# Patient Record
Sex: Female | Born: 1965 | Race: Asian | Hispanic: No | Marital: Married | Smoking: Never smoker
Health system: Southern US, Community
[De-identification: ages and names within clinical notes are randomized; demographics above are authoritative.]

## PROBLEM LIST (undated history)

## (undated) DIAGNOSIS — T8859XA Other complications of anesthesia, initial encounter: Secondary | ICD-10-CM

## (undated) DIAGNOSIS — Z9889 Other specified postprocedural states: Secondary | ICD-10-CM

## (undated) DIAGNOSIS — R011 Cardiac murmur, unspecified: Secondary | ICD-10-CM

## (undated) DIAGNOSIS — B009 Herpesviral infection, unspecified: Secondary | ICD-10-CM

## (undated) DIAGNOSIS — N950 Postmenopausal bleeding: Secondary | ICD-10-CM

## (undated) DIAGNOSIS — R519 Headache, unspecified: Secondary | ICD-10-CM

## (undated) DIAGNOSIS — G4733 Obstructive sleep apnea (adult) (pediatric): Secondary | ICD-10-CM

## (undated) DIAGNOSIS — E785 Hyperlipidemia, unspecified: Secondary | ICD-10-CM

## (undated) HISTORY — PX: ARTHROSCOPIC REPAIR ACL: SUR80

## (undated) HISTORY — PX: ANTERIOR CRUCIATE LIGAMENT REPAIR: SHX115

## (undated) HISTORY — PX: WISDOM TOOTH EXTRACTION: SHX21

## (undated) HISTORY — PX: SALPINGOOPHORECTOMY: SHX82

## (undated) HISTORY — PX: OTHER SURGICAL HISTORY: SHX169

## (undated) HISTORY — PX: EYE SURGERY: SHX253

---

## 2007-07-07 DIAGNOSIS — Z87442 Personal history of urinary calculi: Secondary | ICD-10-CM

## 2007-07-07 HISTORY — DX: Personal history of urinary calculi: Z87.442

## 2014-10-18 ENCOUNTER — Other Ambulatory Visit (HOSPITAL_COMMUNITY)
Admission: RE | Admit: 2014-10-18 | Discharge: 2014-10-18 | Disposition: A | Discharge status: Home / Self Care | Payer: Managed Care, Other (non HMO) | Source: Ambulatory Visit | Attending: Internal Medicine | Admitting: Internal Medicine

## 2014-10-18 DIAGNOSIS — Z1151 Encounter for screening for human papillomavirus (HPV): Secondary | ICD-10-CM | POA: Diagnosis present

## 2014-10-18 DIAGNOSIS — Z01411 Encounter for gynecological examination (general) (routine) with abnormal findings: Secondary | ICD-10-CM | POA: Insufficient documentation

## 2014-10-24 ENCOUNTER — Other Ambulatory Visit: Payer: Self-pay

## 2014-10-24 DIAGNOSIS — Z1231 Encounter for screening mammogram for malignant neoplasm of breast: Secondary | ICD-10-CM

## 2014-11-15 ENCOUNTER — Ambulatory Visit: Payer: Self-pay

## 2014-11-22 ENCOUNTER — Ambulatory Visit
Admission: RE | Admit: 2014-11-22 | Discharge: 2014-11-22 | Disposition: A | Discharge status: Home / Self Care | Payer: Managed Care, Other (non HMO) | Source: Ambulatory Visit

## 2014-11-22 DIAGNOSIS — Z1231 Encounter for screening mammogram for malignant neoplasm of breast: Secondary | ICD-10-CM

## 2015-03-07 ENCOUNTER — Other Ambulatory Visit (HOSPITAL_COMMUNITY)
Admission: RE | Admit: 2015-03-07 | Discharge: 2015-03-07 | Disposition: A | Discharge status: Home / Self Care | Payer: Managed Care, Other (non HMO) | Source: Ambulatory Visit | Attending: Internal Medicine | Admitting: Internal Medicine

## 2015-03-07 ENCOUNTER — Other Ambulatory Visit: Payer: Self-pay | Admitting: Internal Medicine

## 2015-03-07 DIAGNOSIS — R87810 Cervical high risk human papillomavirus (HPV) DNA test positive: Secondary | ICD-10-CM | POA: Insufficient documentation

## 2015-03-07 DIAGNOSIS — Z1151 Encounter for screening for human papillomavirus (HPV): Secondary | ICD-10-CM | POA: Insufficient documentation

## 2015-03-07 DIAGNOSIS — Z01411 Encounter for gynecological examination (general) (routine) with abnormal findings: Secondary | ICD-10-CM | POA: Diagnosis present

## 2015-03-13 LAB — CYTOLOGY - PAP

## 2015-08-21 ENCOUNTER — Other Ambulatory Visit: Payer: Self-pay | Admitting: Orthopedic Surgery

## 2015-08-21 DIAGNOSIS — R52 Pain, unspecified: Secondary | ICD-10-CM

## 2015-08-21 DIAGNOSIS — R609 Edema, unspecified: Secondary | ICD-10-CM

## 2015-08-28 ENCOUNTER — Other Ambulatory Visit: Payer: Self-pay

## 2015-08-29 ENCOUNTER — Ambulatory Visit
Admission: RE | Admit: 2015-08-29 | Discharge: 2015-08-29 | Disposition: A | Discharge status: Home / Self Care | Payer: Managed Care, Other (non HMO) | Source: Ambulatory Visit | Attending: Orthopedic Surgery | Admitting: Orthopedic Surgery

## 2015-08-29 DIAGNOSIS — R609 Edema, unspecified: Secondary | ICD-10-CM

## 2015-08-29 DIAGNOSIS — R52 Pain, unspecified: Secondary | ICD-10-CM

## 2015-09-24 DIAGNOSIS — Z9889 Other specified postprocedural states: Secondary | ICD-10-CM | POA: Insufficient documentation

## 2015-10-24 ENCOUNTER — Other Ambulatory Visit: Payer: Self-pay | Admitting: Internal Medicine

## 2015-10-24 ENCOUNTER — Other Ambulatory Visit (HOSPITAL_COMMUNITY)
Admission: RE | Admit: 2015-10-24 | Discharge: 2015-10-24 | Disposition: A | Discharge status: Home / Self Care | Payer: Managed Care, Other (non HMO) | Source: Ambulatory Visit | Attending: Internal Medicine | Admitting: Internal Medicine

## 2015-10-24 DIAGNOSIS — Z01419 Encounter for gynecological examination (general) (routine) without abnormal findings: Secondary | ICD-10-CM | POA: Diagnosis present

## 2015-10-28 LAB — CYTOLOGY - PAP

## 2016-02-11 ENCOUNTER — Other Ambulatory Visit: Payer: Self-pay | Admitting: Internal Medicine

## 2016-02-11 DIAGNOSIS — Z1231 Encounter for screening mammogram for malignant neoplasm of breast: Secondary | ICD-10-CM

## 2016-02-17 ENCOUNTER — Ambulatory Visit
Admission: RE | Admit: 2016-02-17 | Discharge: 2016-02-17 | Disposition: A | Discharge status: Home / Self Care | Payer: Managed Care, Other (non HMO) | Source: Ambulatory Visit | Attending: Internal Medicine | Admitting: Internal Medicine

## 2016-02-17 DIAGNOSIS — Z1231 Encounter for screening mammogram for malignant neoplasm of breast: Secondary | ICD-10-CM

## 2016-11-06 ENCOUNTER — Other Ambulatory Visit: Payer: Self-pay | Admitting: Internal Medicine

## 2016-11-06 ENCOUNTER — Other Ambulatory Visit (HOSPITAL_COMMUNITY)
Admission: RE | Admit: 2016-11-06 | Discharge: 2016-11-06 | Disposition: A | Discharge status: Home / Self Care | Payer: Managed Care, Other (non HMO) | Source: Ambulatory Visit | Attending: Internal Medicine | Admitting: Internal Medicine

## 2016-11-06 DIAGNOSIS — Z01411 Encounter for gynecological examination (general) (routine) with abnormal findings: Secondary | ICD-10-CM | POA: Diagnosis present

## 2016-11-12 LAB — CYTOLOGY - PAP: DIAGNOSIS: NEGATIVE

## 2017-04-14 ENCOUNTER — Other Ambulatory Visit: Payer: Self-pay | Admitting: Internal Medicine

## 2017-04-14 DIAGNOSIS — Z1231 Encounter for screening mammogram for malignant neoplasm of breast: Secondary | ICD-10-CM

## 2017-05-03 ENCOUNTER — Ambulatory Visit
Admission: RE | Admit: 2017-05-03 | Discharge: 2017-05-03 | Disposition: A | Discharge status: Home / Self Care | Payer: Managed Care, Other (non HMO) | Source: Ambulatory Visit | Attending: Internal Medicine | Admitting: Internal Medicine

## 2017-05-03 ENCOUNTER — Encounter (INDEPENDENT_AMBULATORY_CARE_PROVIDER_SITE_OTHER): Payer: Self-pay

## 2017-05-03 ENCOUNTER — Encounter: Payer: Self-pay | Admitting: Radiology

## 2017-05-03 DIAGNOSIS — Z1231 Encounter for screening mammogram for malignant neoplasm of breast: Secondary | ICD-10-CM

## 2018-11-30 ENCOUNTER — Other Ambulatory Visit (HOSPITAL_COMMUNITY)
Admission: RE | Admit: 2018-11-30 | Discharge: 2018-11-30 | Disposition: A | Discharge status: Home / Self Care | Payer: Managed Care, Other (non HMO) | Source: Ambulatory Visit | Attending: Internal Medicine | Admitting: Internal Medicine

## 2018-11-30 ENCOUNTER — Other Ambulatory Visit: Payer: Self-pay | Admitting: Internal Medicine

## 2018-11-30 DIAGNOSIS — Z01411 Encounter for gynecological examination (general) (routine) with abnormal findings: Secondary | ICD-10-CM | POA: Insufficient documentation

## 2018-12-06 LAB — CYTOLOGY - PAP
Diagnosis: NEGATIVE
HPV: NOT DETECTED

## 2018-12-30 ENCOUNTER — Ambulatory Visit: Payer: Managed Care, Other (non HMO) | Admitting: Podiatry

## 2018-12-30 ENCOUNTER — Other Ambulatory Visit: Payer: Self-pay

## 2018-12-30 ENCOUNTER — Ambulatory Visit (INDEPENDENT_AMBULATORY_CARE_PROVIDER_SITE_OTHER): Payer: Managed Care, Other (non HMO)

## 2018-12-30 ENCOUNTER — Encounter: Payer: Self-pay | Admitting: Podiatry

## 2018-12-30 VITALS — BP 121/82 | HR 80 | Temp 98.0°F | Resp 16

## 2018-12-30 DIAGNOSIS — M216X1 Other acquired deformities of right foot: Secondary | ICD-10-CM | POA: Diagnosis not present

## 2018-12-30 DIAGNOSIS — M21619 Bunion of unspecified foot: Secondary | ICD-10-CM

## 2018-12-30 DIAGNOSIS — M779 Enthesopathy, unspecified: Secondary | ICD-10-CM

## 2018-12-30 DIAGNOSIS — M722 Plantar fascial fibromatosis: Secondary | ICD-10-CM

## 2018-12-30 DIAGNOSIS — M778 Other enthesopathies, not elsewhere classified: Secondary | ICD-10-CM

## 2018-12-30 DIAGNOSIS — M76829 Posterior tibial tendinitis, unspecified leg: Secondary | ICD-10-CM

## 2018-12-30 NOTE — Patient Instructions (Signed)

## 2019-01-01 NOTE — Progress Notes (Signed)
Subjective:   Patient ID: Tanya Wall, female   DOB: 53 y.o.   MRN: [id number]   HPI 53 year old female presents the office today for concerns of left foot pain which is been aching for about 6 months.  She states that she feels a pulling sensation in the arch of her foot when it is dorsiflexed and she cannot go barefoot.  She is trying to wear cushioned shoes and she is interested in possibly orthotics.  She states that she is been playing tennis more since been out of work due to the pandemic and she is also a Print production planner.  She denies any recent injury to her foot or any significant swelling.  She also gets a numbness to her toes at times.  This is on the left side.  No radiating pain.  No weakness.  No falls. She states that she did have plantar fasciitis and she self treated.    Review of Systems  All other systems reviewed and are negative.  No past medical history on file.  No past surgical history on file.   Current Outpatient Medications:  .  citalopram (CELEXA) 40 MG tablet, , Disp: , Rfl:  .  valACYclovir (VALTREX) 1000 MG tablet, , Disp: , Rfl:  .  zolpidem (AMBIEN) 5 MG tablet, , Disp: , Rfl:   No Known Allergies        Objective:  Physical Exam  General: AAO x3, NAD  Dermatological: Skin is warm, dry and supple bilateral. Nails x 10 are well manicured; remaining integument appears unremarkable at this time. There are no open sores, no preulcerative lesions, no rash or signs of infection present.  Vascular: Dorsalis Pedis artery and Posterior Tibial artery pedal pulses are 2/4 bilateral with immedate capillary fill time. There is no pain with calf compression, swelling, warmth, erythema.   Neruologic: Grossly intact via light touch bilateral.Protective threshold with Semmes Wienstein monofilament intact to all pedal sites bilateral.  Negative Tinel sign bilaterally.  Musculoskeletal: Flatfoot deformities present bilaterally.  Majority of tenderness is  submetatarsal 2 as well as on the plantar plate of the second digit.  Bunion deformity is present.  There is no area of pinpoint tenderness.  No palpable neuroma was identified.  Hammertoe contractures are starting.  No pain to the ankle.  Ankle, subtalar range of motion intact.  Flexor, extensor tendons appear to be intact.  Mild discomfort along medial band plantar fashion with the arch of the foot.  Muscular strength 5/5 in all groups tested bilateral.  Gait: Unassisted, Nonantalgic.       Assessment:   Left foot plantar fasciitis, capsulitis/metatarsalgia    Plan:  -Treatment options discussed including all alternatives, risks, and complications -Etiology of symptoms were discussed -X-rays were obtained and reviewed with the patient.  Bunion deformities present.  Starting of hammertoe contractures present.  No evidence of acute fracture.  Flatfoot is present. -We long discussion regards to treatment options.  I do think a lot of her forefoot pain is result of her flatfoot, bunion as well as her residual deformities.  We discussed traction exercises of her toes as well as her plantar fasciitis.  Discussed shoe modifications.  Ultimately I do think she will benefit from orthotics.  I will have her follow-up with Liliane Channel for inserts.  Hope this will help give her some arch support as well as with daily metatarsal offloading.  Trula Slade DPM

## 2019-01-09 ENCOUNTER — Ambulatory Visit (INDEPENDENT_AMBULATORY_CARE_PROVIDER_SITE_OTHER): Payer: Managed Care, Other (non HMO) | Admitting: Orthotics

## 2019-01-09 ENCOUNTER — Other Ambulatory Visit: Payer: Self-pay

## 2019-01-09 DIAGNOSIS — M778 Other enthesopathies, not elsewhere classified: Secondary | ICD-10-CM

## 2019-01-09 DIAGNOSIS — M722 Plantar fascial fibromatosis: Secondary | ICD-10-CM

## 2019-01-09 DIAGNOSIS — M216X2 Other acquired deformities of left foot: Secondary | ICD-10-CM

## 2019-01-09 DIAGNOSIS — M216X1 Other acquired deformities of right foot: Secondary | ICD-10-CM | POA: Diagnosis not present

## 2019-01-09 NOTE — Progress Notes (Signed)
Patient came into today to be cast for Custom Foot Orthotics. Upon recommendation of Dr. 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

## 2019-01-30 ENCOUNTER — Other Ambulatory Visit: Payer: Self-pay

## 2019-01-30 ENCOUNTER — Ambulatory Visit: Payer: Managed Care, Other (non HMO) | Admitting: Orthotics

## 2019-01-30 DIAGNOSIS — M216X1 Other acquired deformities of right foot: Secondary | ICD-10-CM

## 2019-01-30 DIAGNOSIS — M778 Other enthesopathies, not elsewhere classified: Secondary | ICD-10-CM

## 2019-01-30 NOTE — Progress Notes (Signed)
Patient came in today to pick up custom made foot orthotics.  The goals were accomplished and the patient reported no dissatisfaction with said orthotics.  Patient was advised of breakin period and how to report any issues. 

## 2019-04-18 ENCOUNTER — Other Ambulatory Visit: Payer: Self-pay | Admitting: Internal Medicine

## 2019-04-18 DIAGNOSIS — Z1231 Encounter for screening mammogram for malignant neoplasm of breast: Secondary | ICD-10-CM

## 2019-04-19 ENCOUNTER — Ambulatory Visit
Admission: RE | Admit: 2019-04-19 | Discharge: 2019-04-19 | Disposition: A | Discharge status: Home / Self Care | Payer: Managed Care, Other (non HMO) | Source: Ambulatory Visit | Attending: Internal Medicine | Admitting: Internal Medicine

## 2019-04-19 ENCOUNTER — Other Ambulatory Visit: Payer: Self-pay

## 2019-04-19 DIAGNOSIS — Z1231 Encounter for screening mammogram for malignant neoplasm of breast: Secondary | ICD-10-CM

## 2019-08-21 ENCOUNTER — Emergency Department (HOSPITAL_COMMUNITY)
Admission: EM | Admit: 2019-08-21 | Discharge: 2019-08-21 | Disposition: A | Discharge status: Home / Self Care | Payer: Managed Care, Other (non HMO) | Attending: Emergency Medicine | Admitting: Emergency Medicine

## 2019-08-21 ENCOUNTER — Encounter (HOSPITAL_COMMUNITY): Payer: Self-pay | Admitting: Emergency Medicine

## 2019-08-21 ENCOUNTER — Other Ambulatory Visit: Payer: Self-pay

## 2019-08-21 ENCOUNTER — Emergency Department (HOSPITAL_COMMUNITY): Payer: Managed Care, Other (non HMO)

## 2019-08-21 DIAGNOSIS — S4992XA Unspecified injury of left shoulder and upper arm, initial encounter: Secondary | ICD-10-CM | POA: Diagnosis present

## 2019-08-21 DIAGNOSIS — Y9389 Activity, other specified: Secondary | ICD-10-CM | POA: Diagnosis not present

## 2019-08-21 DIAGNOSIS — S43015A Anterior dislocation of left humerus, initial encounter: Secondary | ICD-10-CM | POA: Insufficient documentation

## 2019-08-21 DIAGNOSIS — Y929 Unspecified place or not applicable: Secondary | ICD-10-CM | POA: Insufficient documentation

## 2019-08-21 DIAGNOSIS — W010XXA Fall on same level from slipping, tripping and stumbling without subsequent striking against object, initial encounter: Secondary | ICD-10-CM | POA: Insufficient documentation

## 2019-08-21 DIAGNOSIS — Y999 Unspecified external cause status: Secondary | ICD-10-CM | POA: Insufficient documentation

## 2019-08-21 MED ORDER — LIDOCAINE HCL (PF) 1 % IJ SOLN
30.0000 mL | Freq: Once | INTRAMUSCULAR | Status: DC
  Filled 2019-08-21: qty 30

## 2019-08-21 MED ORDER — ONDANSETRON HCL 4 MG/2ML IJ SOLN
4.0000 mg | Freq: Once | INTRAMUSCULAR | Status: DC
  Filled 2019-08-21: qty 2

## 2019-08-21 MED ORDER — FENTANYL CITRATE (PF) 100 MCG/2ML IJ SOLN
50.0000 ug | Freq: Once | INTRAMUSCULAR | Status: AC
  Administered 2019-08-21: 23:00:00 50 ug via INTRAVENOUS

## 2019-08-21 MED ORDER — FENTANYL CITRATE (PF) 100 MCG/2ML IJ SOLN
50.0000 ug | Freq: Once | INTRAMUSCULAR | Status: AC
  Administered 2019-08-21: 50 ug via INTRAVENOUS
  Filled 2019-08-21: qty 2

## 2019-08-21 MED ORDER — FENTANYL CITRATE (PF) 100 MCG/2ML IJ SOLN
INTRAMUSCULAR | Status: AC
  Filled 2019-08-21: qty 2

## 2019-08-21 MED ORDER — ETOMIDATE 2 MG/ML IV SOLN
7.0000 mg | Freq: Once | INTRAVENOUS | Status: DC
  Filled 2019-08-21: qty 10

## 2019-08-21 NOTE — ED Triage Notes (Signed)
Pt c/o shoulder pain after being tripped by her dog. Pt shoulder splinted on arrival.

## 2019-08-21 NOTE — ED Provider Notes (Signed)
**Note Tanya-Identified via Obfuscation** [facility] EMERGENCY DEPARTMENT Provider Note   CSN: [id number] Arrival date & time: 08/21/19  2023     History Chief Complaint  Patient presents with  . Shoulder Pain    Tanya Wall is a 54 y.o. female.  The history is provided by the patient.  Shoulder Pain Location:  Shoulder Shoulder location:  L shoulder Injury: yes   Time since incident:  2 hours Mechanism of injury: fall   Fall:    Fall occurred:  Tripped (tripped over dog)   Height of fall:  Scientist, water quality of impact:  Outstretched arms Pain details:    Quality:  Aching   Severity:  Severe   Onset quality:  Sudden   Timing:  Constant   Progression:  Unchanged Dislocation: yes   Prior injury to area:  No Relieved by:  Immobilization Worsened by:  Movement and bearing weight Associated symptoms: no back pain, no fever, no muscle weakness, no neck pain and no numbness        History reviewed. No pertinent past medical history.  Patient Active Problem List   Diagnosis Date Noted  . S/P left knee arthroscopy 09/24/2015    History reviewed. No pertinent surgical history.   OB History   No obstetric history on file.     Family History  Problem Relation Age of Onset  . Breast cancer Paternal Aunt 45    Social History   Tobacco Use  . Smoking status: Never Smoker  . Smokeless tobacco: Never Used  Substance Use Topics  . Alcohol use: Yes  . Drug use: Not on file    Home Medications Prior to Admission medications   Medication Sig Start Date End Date Taking? Authorizing Provider  BIOTIN PO Take 1 tablet by mouth daily.   Yes [provider]  citalopram (CELEXA) 40 MG tablet Take 40 mg by mouth at bedtime.  12/08/18  Yes [provider]  Probiotic Product (MISC INTESTINAL FLORA REGULAT) CHEW Chew 1 each by mouth daily.   Yes [provider]  zolpidem (AMBIEN) 5 MG tablet Take 5 mg by mouth at bedtime.  12/28/18  Yes [provider]    Allergies    Patient has no known allergies.  Review of Systems   Review of Systems  Constitutional: Negative for fever.  Cardiovascular: Negative for chest pain.  Musculoskeletal: Negative for back pain and neck pain.  Neurological: Negative for headaches.  All other systems reviewed and are negative.   Physical Exam Updated Vital Signs BP (!) 147/95 (BP Location: Right Arm)   Pulse 81   Temp 100 F (37.8 C) (Oral)   Resp 20   Ht 5\' 5"  (1.651 m)   Wt 68.9 kg   SpO2 97%   BMI 25.29 kg/m   Physical Exam Vitals and nursing note reviewed.  Constitutional:      Appearance: She is well-developed. She is not toxic-appearing or diaphoretic.  HENT:     Head: Normocephalic and atraumatic.     Mouth/Throat:     Mouth: Mucous membranes are moist.     Pharynx: Oropharynx is clear.  Eyes:     General: No scleral icterus.    Conjunctiva/sclera: Conjunctivae normal.  Neck:     Comments: No c-spine TTP, step-off or deformity Cardiovascular:     Rate and Rhythm: Normal rate and regular rhythm.     Pulses: Normal pulses.     Heart sounds: No murmur.  Pulmonary:  Effort: Pulmonary effort is normal. No respiratory distress.     Breath sounds: Normal breath sounds.  Abdominal:     Palpations: Abdomen is soft.     Tenderness: There is no abdominal tenderness.  Musculoskeletal:        General: Tenderness (left shoulder) and deformity (left shoulder) present.     Cervical back: Normal range of motion and neck supple.  Skin:    General: Skin is warm and dry.  Neurological:     Mental Status: She is alert.     Sensory: No sensory deficit.     Motor: No weakness.  Psychiatric:        Mood and Affect: Mood normal.        Behavior: Behavior normal.     ED Results / Procedures / Treatments   Labs (all labs ordered are listed, but only abnormal results are displayed) Labs Reviewed - No data to display  EKG None  Radiology DG Shoulder Left  Result Date:  08/21/2019 CLINICAL DATA:  Pain after fall EXAM: LEFT SHOULDER - 2+ VIEW COMPARISON:  None. FINDINGS: AC joint is intact. Left lung apex is clear. Anterior inferior dislocation of the left humeral head with respect to the glenoid fossa. No definitive fracture is seen. IMPRESSION: Anterior inferior dislocation of the humeral head with respect to the glenoid fossa Electronically Signed   By: Jasmine Pang M.D.   On: 08/21/2019 21:33   DG Shoulder Left Portable  Result Date: 08/21/2019 CLINICAL DATA:  Dislocation status post reduction EXAM: LEFT SHOULDER COMPARISON:  08/21/2019 at 9:15 p.m. FINDINGS: Frontal and transscapular views of the left shoulder demonstrate reduction of the anterior dislocation seen previously. There is still slight anterior subluxation of the glenohumeral joint. No underlying fracture. Left chest is clear. IMPRESSION: 1. Reduction of glenohumeral dislocation, with minimal anterior subluxation of the humeral head relative to the glenoid fossa. 2. No acute fracture. Electronically Signed   By: Sharlet Salina M.D.   On: 08/21/2019 23:28    Procedures Reduction of dislocation  Date/Time: 08/21/2019 11:47 PM Performed by: Nardos Putnam, [place], MD Authorized by: Terald Sleeper, MD  Consent: Written consent obtained. Risks and benefits: risks, benefits and alternatives were discussed Consent given by: patient Patient understanding: patient states understanding of the procedure being performed Patient consent: the patient's understanding of the procedure matches consent given Procedure consent: procedure consent matches procedure scheduled Relevant documents: relevant documents present and verified Patient identity confirmed: verbally with patient and arm band Time out: Immediately prior to procedure a "time out" was called to verify the correct patient, procedure, equipment, support staff and site/side marked as required. Preparation: Patient was prepped and draped in the usual  sterile fashion. Local anesthesia used: yes Anesthesia method: intra-articular injection.  Anesthesia: Local anesthesia used: yes Local Anesthetic: lidocaine 1% without epinephrine Anesthetic total: 10 mL  Sedation: Patient sedated: no  Patient tolerance: patient tolerated the procedure well with no immediate complications    (including critical care time)  Medications Ordered in ED Medications  ondansetron (ZOFRAN) injection 4 mg (has no administration in time range)  etomidate (AMIDATE) injection 7 mg (has no administration in time range)  lidocaine (PF) (XYLOCAINE) 1 % injection 30 mL (has no administration in time range)  fentaNYL (SUBLIMAZE) 100 MCG/2ML injection (has no administration in time range)  fentaNYL (SUBLIMAZE) injection 50 mcg (50 mcg Intravenous Given 08/21/19 2158)  fentaNYL (SUBLIMAZE) injection 50 mcg (50 mcg Intravenous Given 08/21/19 2255)    ED Course  I  have reviewed the triage vital signs and the nursing notes.  Pertinent labs & imaging results that were available during my care of the patient were reviewed by me and considered in my medical decision making (see chart for details).  Clinical Course as of Aug 20 2344  Mon Aug 21, 2019  2320 Attempted bedside reduction with local lidocaine injection, tolerated well, awaiting xray film   [MT]  2335 Neurovascularly intact following reduction.  Will place in sling and have her f/u with ortho in 1 week.   [MT]    Clinical Course User Index [MT] Wyvonnia Dusky, MD   MDM Rules/Calculators/A&P                      Patient here for left shoulder injury, obvious dislocation on exam, neurovascular intact, anterior dislocation noted on x-ray.  Reduced as above without complication, repeat x-ray shows reduction with minimal subluxation, pain significantly improved after reduction.  Placed in sling, will follow up with orthopedic surgery.  Strict return precautions given, patient discharged in stable  condition.  Final Clinical Impression(s) / ED Diagnoses Final diagnoses:  Anterior dislocation of left shoulder, initial encounter    Rx / DC Orders ED Discharge Orders    None       Kaitland Lewellyn, [place], MD 08/21/19 2351    Wyvonnia Dusky, MD 08/22/19 1156

## 2019-08-21 NOTE — ED Notes (Signed)
Consent signed by pt for conscious sedation and reduction of left shoulder dislocation after procedure explained to pt by MD

## 2019-08-21 NOTE — ED Notes (Signed)
Dr. Renaye Rakers attempting to reduce Left arm dislocation

## 2019-12-11 ENCOUNTER — Other Ambulatory Visit (HOSPITAL_COMMUNITY)
Admission: RE | Admit: 2019-12-11 | Discharge: 2019-12-11 | Disposition: A | Discharge status: Home / Self Care | Payer: Managed Care, Other (non HMO) | Source: Ambulatory Visit | Attending: Internal Medicine | Admitting: Internal Medicine

## 2019-12-11 ENCOUNTER — Other Ambulatory Visit: Payer: Self-pay | Admitting: Internal Medicine

## 2019-12-11 DIAGNOSIS — Z124 Encounter for screening for malignant neoplasm of cervix: Secondary | ICD-10-CM | POA: Diagnosis present

## 2019-12-11 DIAGNOSIS — Z1231 Encounter for screening mammogram for malignant neoplasm of breast: Secondary | ICD-10-CM

## 2019-12-11 DIAGNOSIS — Z780 Asymptomatic menopausal state: Secondary | ICD-10-CM

## 2019-12-13 LAB — CYTOLOGY - PAP
Comment: NEGATIVE
Diagnosis: NEGATIVE
High risk HPV: NEGATIVE

## 2020-04-19 ENCOUNTER — Other Ambulatory Visit: Payer: Self-pay

## 2020-04-19 ENCOUNTER — Ambulatory Visit: Payer: Managed Care, Other (non HMO)

## 2020-04-19 ENCOUNTER — Ambulatory Visit
Admission: RE | Admit: 2020-04-19 | Discharge: 2020-04-19 | Disposition: A | Discharge status: Home / Self Care | Payer: Managed Care, Other (non HMO) | Source: Ambulatory Visit | Attending: Internal Medicine | Admitting: Internal Medicine

## 2020-04-19 DIAGNOSIS — Z780 Asymptomatic menopausal state: Secondary | ICD-10-CM

## 2020-05-24 ENCOUNTER — Other Ambulatory Visit: Payer: Self-pay

## 2020-05-24 ENCOUNTER — Ambulatory Visit
Admission: RE | Admit: 2020-05-24 | Discharge: 2020-05-24 | Disposition: A | Discharge status: Home / Self Care | Payer: Managed Care, Other (non HMO) | Source: Ambulatory Visit | Attending: Internal Medicine | Admitting: Internal Medicine

## 2020-05-24 DIAGNOSIS — Z1231 Encounter for screening mammogram for malignant neoplasm of breast: Secondary | ICD-10-CM

## 2020-06-03 ENCOUNTER — Telehealth: Payer: Self-pay | Admitting: Podiatry

## 2020-06-03 NOTE — Telephone Encounter (Signed)
Pt called and left message about getting an another pair of orthotics.  I returned call and explained that if we are billing insurance pt would have to have an appt with the doctor and the cost of the new ones are 438.00 before insurance.  She said last year her insurance covered them. It looks like last yr I verified benefits and they were covered @ 80% after deductible. Pt is not sure if she has met the deductible but is going to check.. I also gave her the cpt for orthotics L3020 to check when she calls the insurance company. She said sometimes the current ones smell and I told pt we maybe able to change the top cover if we refurbish to more like a vinyl/leather type material  so they do not absorb the sweat as bad. We also discussed refurbishing her current ones for the 90.00. Once insurance is checked she may call to make an appt and may get the new pair if covered and refurbish the old ones. She said the old ones have worn on the bottom

## 2020-07-16 ENCOUNTER — Telehealth: Payer: Self-pay | Admitting: Podiatry

## 2020-07-16 DIAGNOSIS — M216X2 Other acquired deformities of left foot: Secondary | ICD-10-CM

## 2020-07-16 DIAGNOSIS — M216X1 Other acquired deformities of right foot: Secondary | ICD-10-CM

## 2020-07-16 NOTE — Telephone Encounter (Signed)
Pt left voicemail stating she is interested in getting her old orthotics fixed(refurbished) and needs to know how to go about it.  I returned call and explained that it 90.00 to refurbish and it is currently taking about 4 wks. She can just drop them off at the front desk, pay the 90.00 fee and I will call when they come back in.  She then asked if there was any way to make them not absorb her sweat and become smelly. I told her we could change the top cover to a leather like material but to put a note on the orthotics when she drops them off. She also asked about getting a second pair of orthotics and I explained she would have to schedule an appt to see the provider if we are to bill insurance since it has been over a year since she has been seen.

## 2020-07-19 NOTE — Telephone Encounter (Signed)
If she is not having any issues then put her on Rick's schedule and mine and I will come over and see her.

## 2021-01-14 ENCOUNTER — Other Ambulatory Visit: Payer: Self-pay | Admitting: Internal Medicine

## 2021-01-14 DIAGNOSIS — E785 Hyperlipidemia, unspecified: Secondary | ICD-10-CM

## 2021-02-11 ENCOUNTER — Ambulatory Visit
Admission: RE | Admit: 2021-02-11 | Discharge: 2021-02-11 | Disposition: A | Discharge status: Home / Self Care | Payer: Self-pay | Source: Ambulatory Visit | Attending: Internal Medicine | Admitting: Internal Medicine

## 2021-02-11 ENCOUNTER — Other Ambulatory Visit: Payer: Self-pay

## 2021-02-11 DIAGNOSIS — E785 Hyperlipidemia, unspecified: Secondary | ICD-10-CM

## 2021-02-13 IMAGING — CR DG SHOULDER 2+V*L*
3 series · 3 of 3 positions shown · non-contrast
Comparison: None.

CLINICAL DATA: Pain after fall

EXAM:
LEFT SHOULDER - 2+ VIEW

[shoulder grashey]
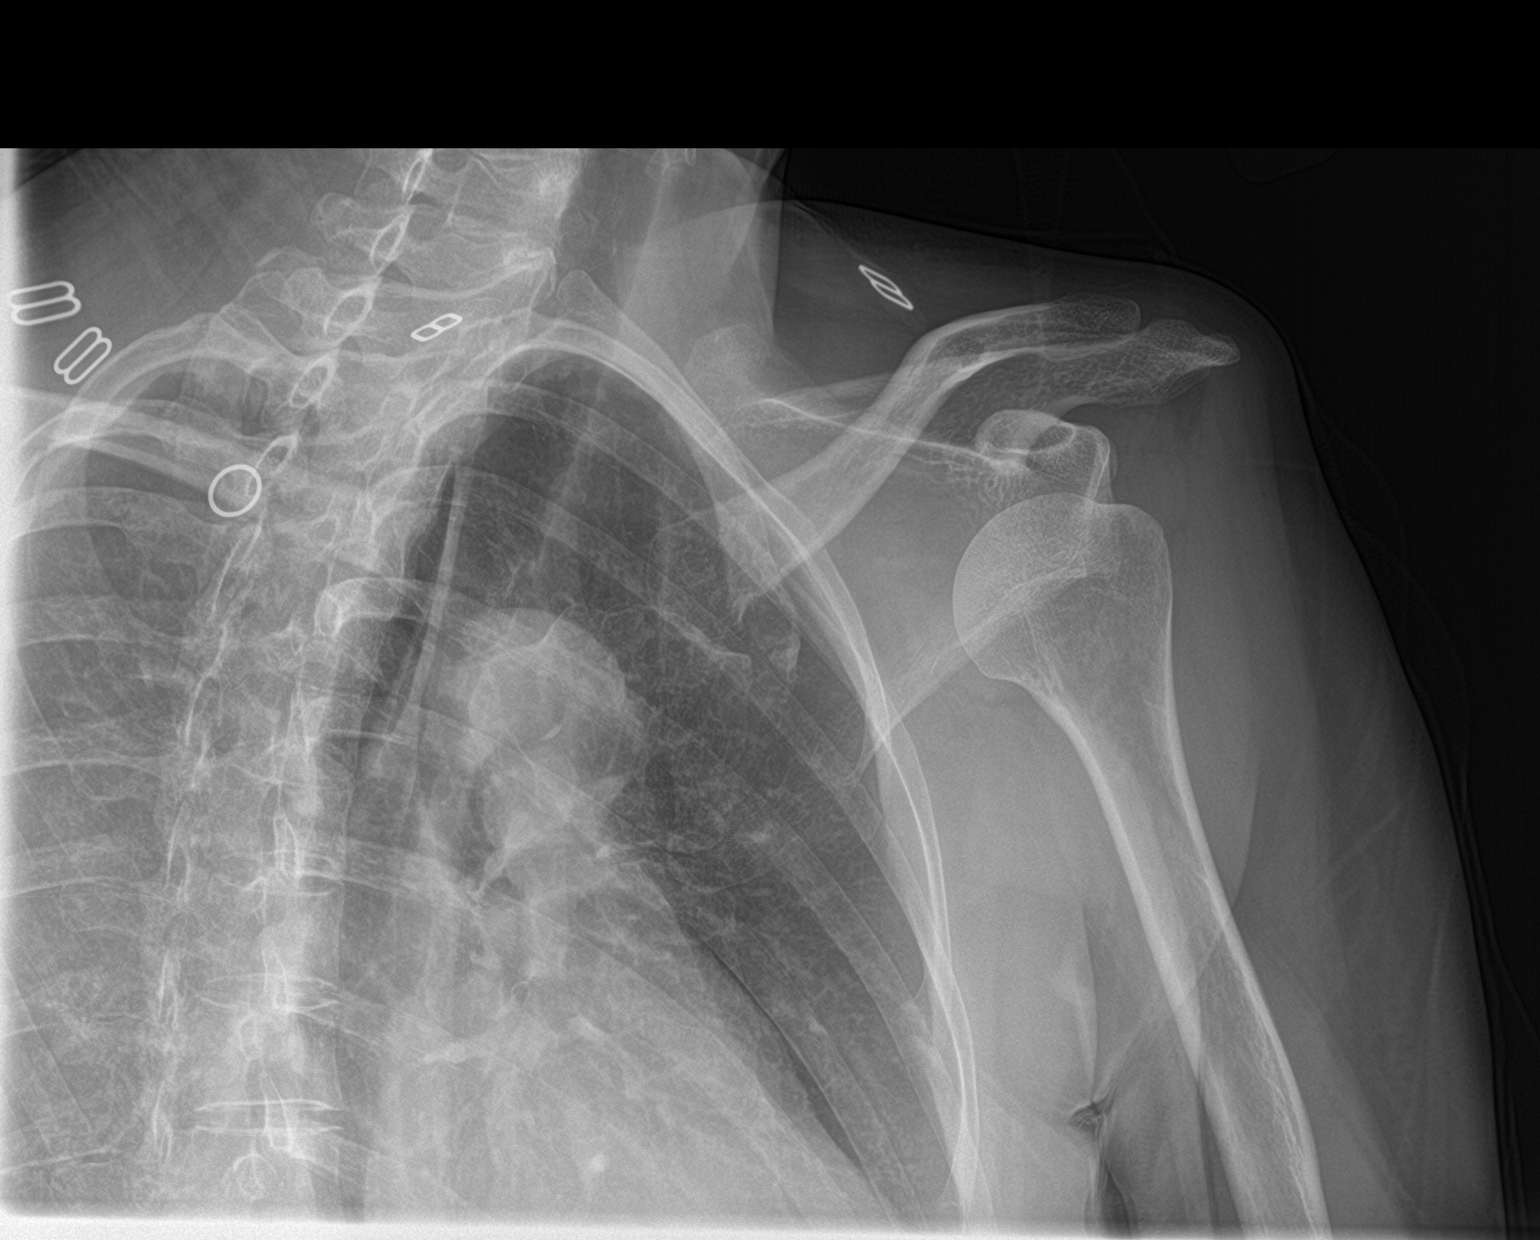

[shoulder y view]
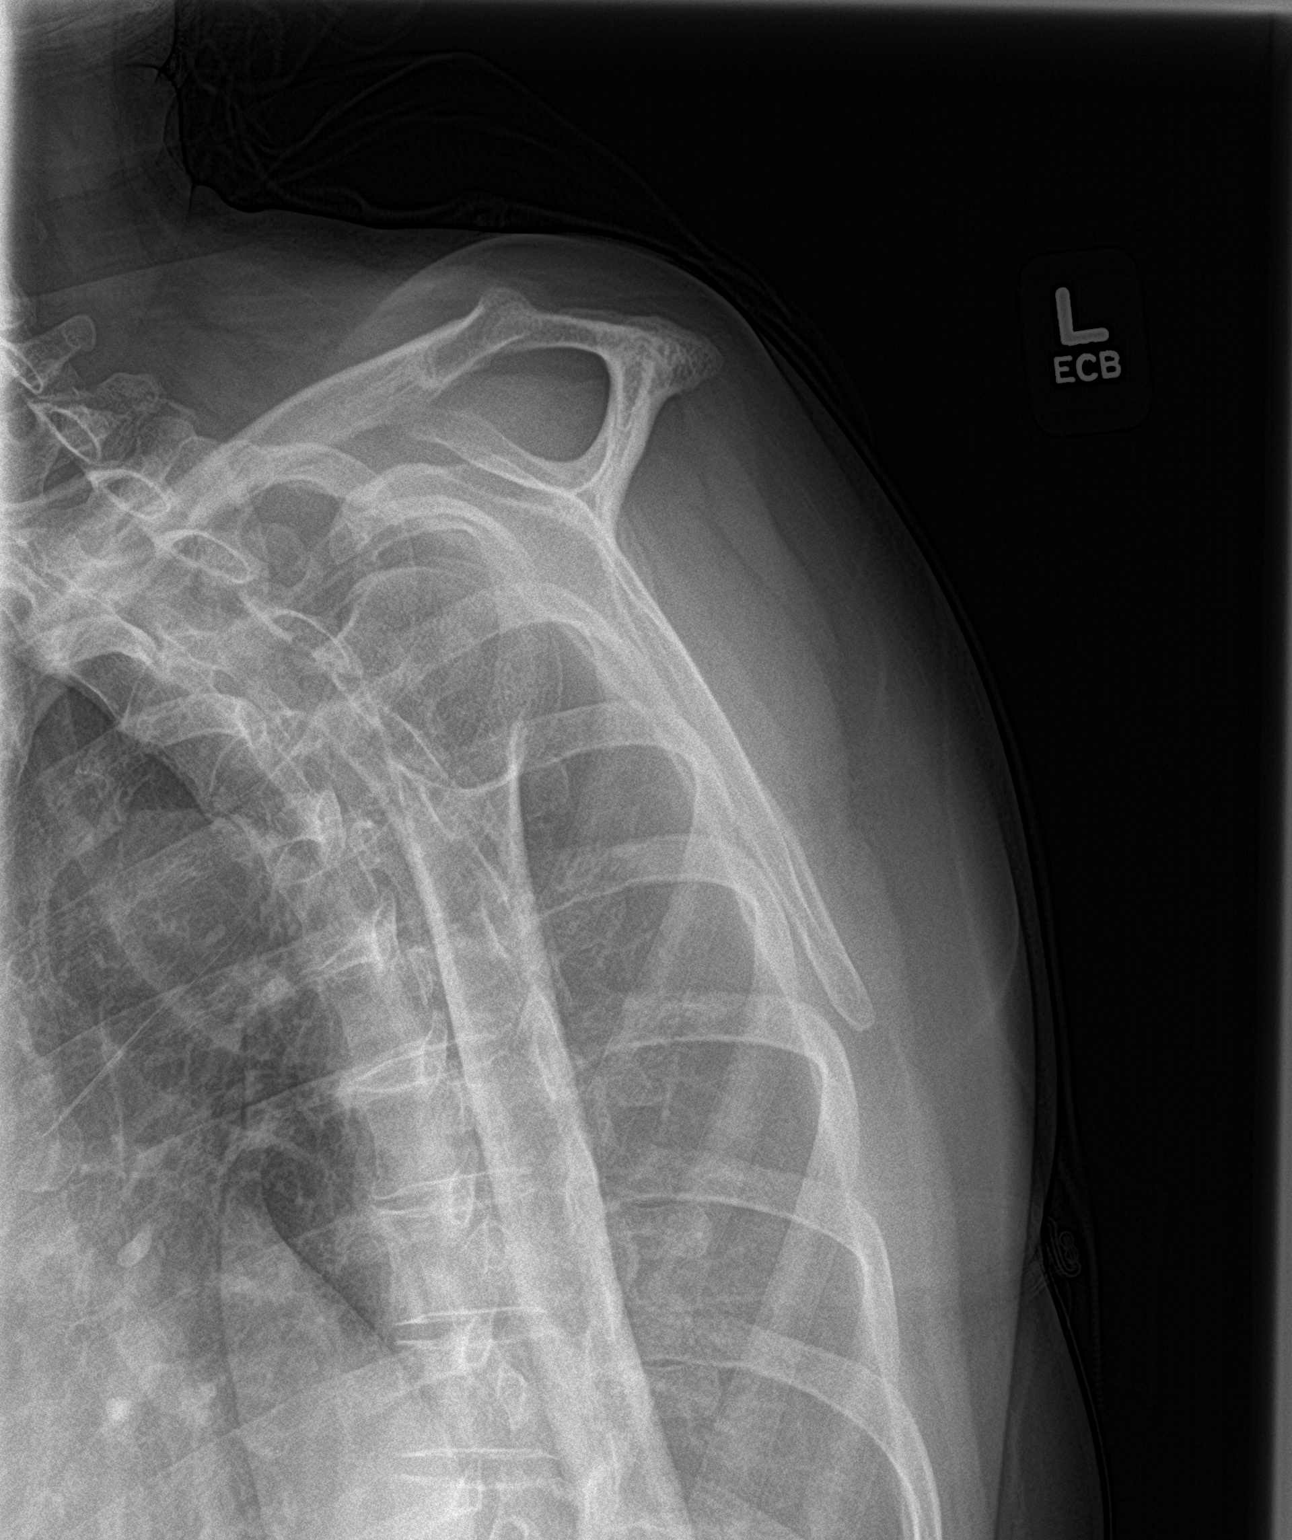

[shoulder ap neutral]
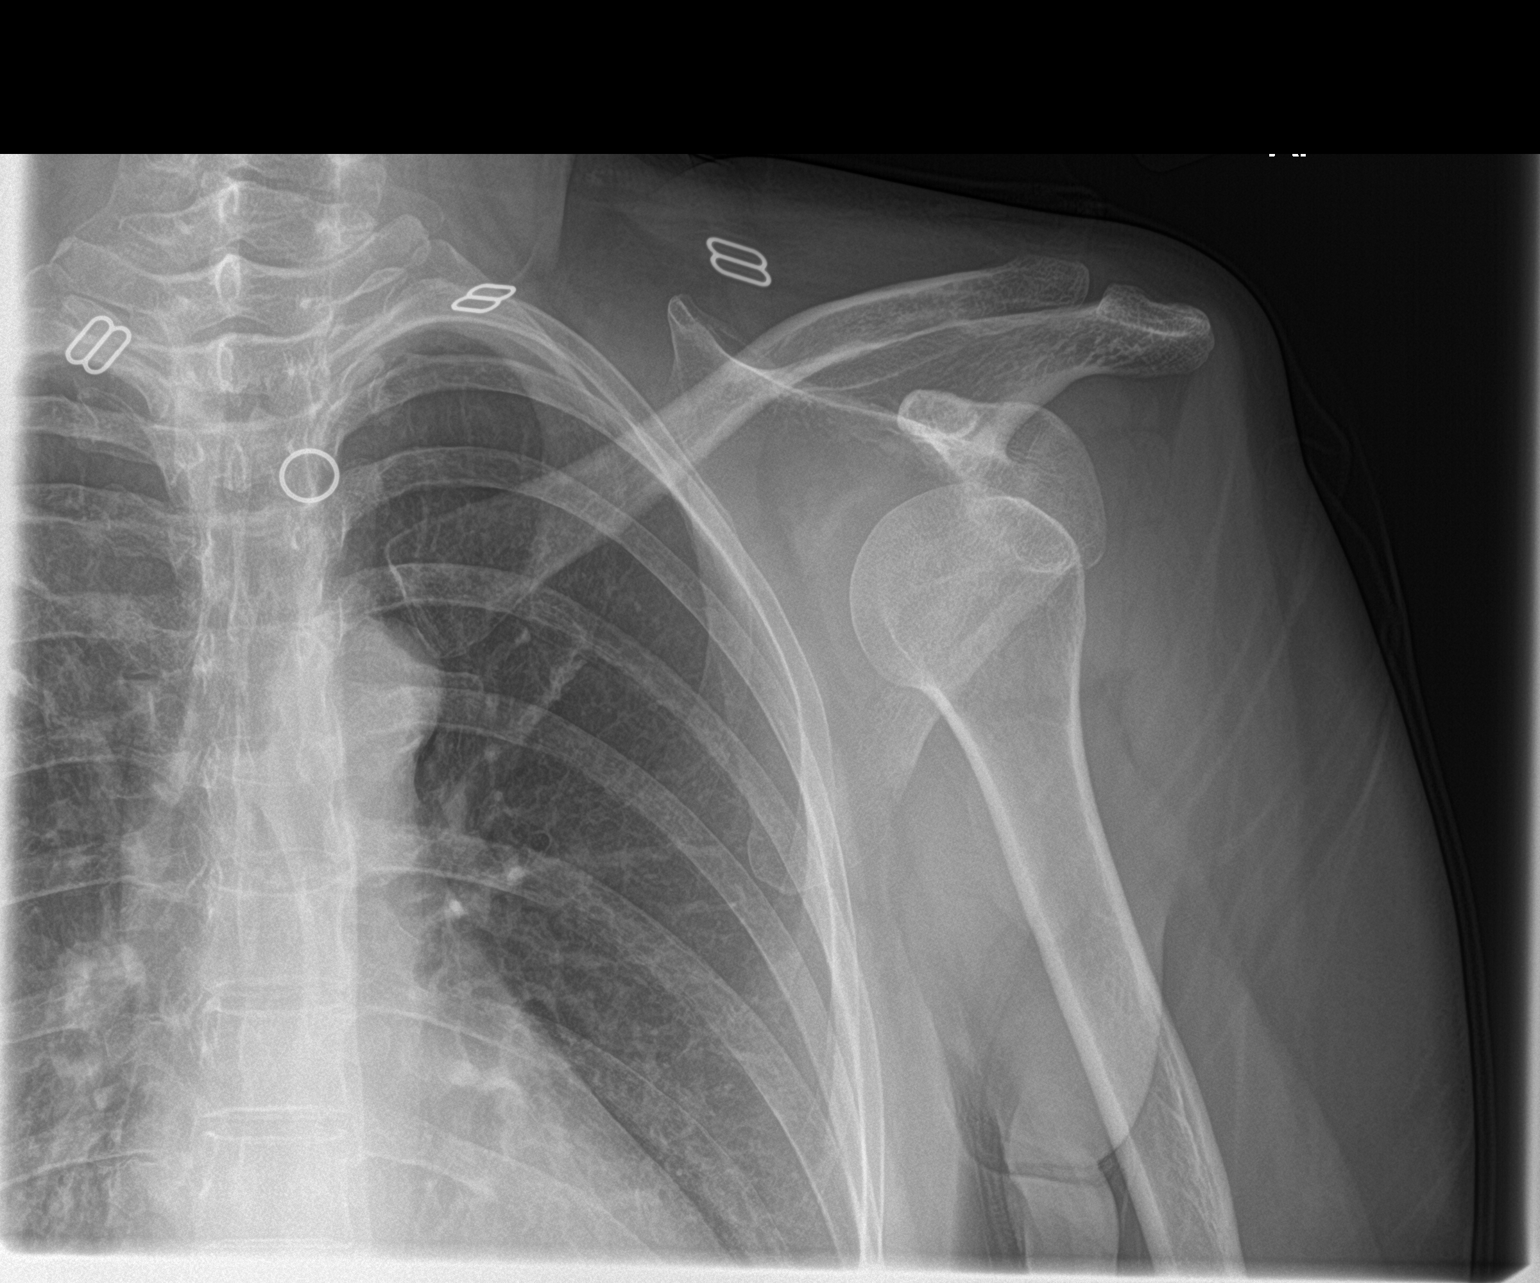

[3 of 3 positions shown; findings below may reference images not displayed]

FINDINGS: AC joint is intact. Left lung apex is clear. Anterior inferior
dislocation of the left humeral head with respect to the glenoid
fossa. No definitive fracture is seen.
IMPRESSION: Anterior inferior dislocation of the humeral head with respect to
the glenoid fossa

## 2022-01-03 DIAGNOSIS — M722 Plantar fascial fibromatosis: Secondary | ICD-10-CM

## 2022-01-03 HISTORY — DX: Plantar fascial fibromatosis: M72.2

## 2022-01-20 ENCOUNTER — Ambulatory Visit: Payer: Managed Care, Other (non HMO) | Admitting: Podiatry

## 2022-01-20 ENCOUNTER — Ambulatory Visit (INDEPENDENT_AMBULATORY_CARE_PROVIDER_SITE_OTHER): Payer: Managed Care, Other (non HMO)

## 2022-01-20 DIAGNOSIS — M779 Enthesopathy, unspecified: Secondary | ICD-10-CM

## 2022-01-20 DIAGNOSIS — M722 Plantar fascial fibromatosis: Secondary | ICD-10-CM

## 2022-01-20 MED ORDER — MELOXICAM 15 MG PO TABS: 15.0000 mg | ORAL_TABLET | Freq: Every day | ORAL | 0 refills | Status: DC | PRN

## 2022-01-20 NOTE — Progress Notes (Signed)
Subjective:   Patient ID: Tanya Wall, female   DOB: 56 y.o.   MRN: [id number]   HPI Chief Complaint  Patient presents with   Foot Pain    Patient reports heel pain to right foot- Patient described pain as sharp and pain is worst in the morning and standing after sitting for long period of time. Pain has been on and off for couple years but has gotten worst in the last 6-8 months.     56 year old female with above complaint.  Thinks her feet are very flat. She had inserts from last appointment which helped. She has had plantar fasciitis for a while but right foot has been getting progressivley worse. 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 No injury. No numbenss or tingling. Pain is worse in the morning or in the middle of the night to go to hte bathroom. Pain after prolonged sitting. Once she gets moving the pain gets better. She played tennis last night and it didn't hurt when playing but it was after when sitting. She stretches which helps some.  She has inserts but she is not sure if it is providing the support that she needs.   Review of Systems  All other systems reviewed and are negative.  No past medical history on file.  No past surgical history on file.   Current Outpatient Medications:    meloxicam (MOBIC) 15 MG tablet, Take 1 tablet (15 mg total) by mouth daily as needed for pain., Disp: 30 tablet, Rfl: 0   BIOTIN PO, Take 1 tablet by mouth daily., Disp: , Rfl:    citalopram (CELEXA) 40 MG tablet, Take 40 mg by mouth at bedtime. , Disp: , Rfl:    Probiotic Product (MISC INTESTINAL FLORA REGULAT) CHEW, Chew 1 each by mouth daily., Disp: , Rfl:    zolpidem (AMBIEN) 5 MG tablet, Take 5 mg by mouth at bedtime. , Disp: , Rfl:   No Known Allergies       Objective:  Physical Exam  General: AAO x3, NAD  Dermatological: Skin is warm, dry and supple bilateral.   Vascular: Dorsalis Pedis artery and Posterior Tibial artery pedal  pulses are 2/4 bilateral with immedate capillary fill time. There is no pain with calf compression, swelling, warmth, erythema.   Neruologic: Grossly intact via light touch bilateral.  Negative Tinel sign.  Musculoskeletal: Tenderness to palpation along the plantar medial tubercle of the calcaneus at the insertion of plantar fascia on the right foot. There is mild discomfort along the course of the plantar fascia within the arch of the foot. Plantar fascia appears to be intact. There is no pain with lateral compression of the calcaneus or pain with vibratory sensation. There is no pain along the course or insertion of the achilles tendon. No other areas of tenderness to bilateral lower extremities.  Muscular strength 5/5 in all groups tested bilateral.  Gait: Unassisted, Nonantalgic.       Assessment:   Right heel pain, Planter fasciitis     Plan:  -Treatment options discussed including all alternatives, risks, and complications -Etiology of symptoms were discussed -X-rays were obtained and reviewed with the patient.  3 views of the right foot were obtained.  No evidence of acute fracture.  No significant calcaneal spurring. -Plantar fascia brace to help aid in support and stability during the day. -She does not report signs of compartment -Prescribed mobic. Discussed side effects of the medication  and directed to stop if any are to occur and call the office.  -Like further from the orthotics since we can evaluate this about possible modifications. -Stretching, icing daily  Vivi Barrack DPM

## 2022-01-20 NOTE — Progress Notes (Signed)
Ot p

## 2022-01-20 NOTE — Patient Instructions (Signed)
For instructions on how to put on your Plantar Fascial Brace, please visit [email or web address]   Plantar Fasciitis (Heel Spur Syndrome) with Rehab The plantar fascia is a fibrous, ligament-like, soft-tissue structure that spans the bottom of the foot. Plantar fasciitis is a condition that causes pain in the foot due to inflammation of the tissue. SYMPTOMS   Pain and tenderness on the underneath side of the foot.  Pain that worsens with standing or walking. CAUSES  Plantar fasciitis is caused by irritation and injury to the plantar fascia on the underneath side of the foot. Common mechanisms of injury include:  Direct trauma to bottom of the foot.  Damage to a small nerve that runs under the foot where the main fascia attaches to the heel bone.  Stress placed on the plantar fascia due to bone spurs. RISK INCREASES WITH:   Activities that place stress on the plantar fascia (running, jumping, pivoting, or cutting).  Poor strength and flexibility.  Improperly fitted shoes.  Tight calf muscles.  Flat feet.  Failure to warm-up properly before activity.  Obesity. PREVENTION  Warm up and stretch properly before activity.  Allow for adequate recovery between workouts.  Maintain physical fitness:  Strength, flexibility, and endurance.  Cardiovascular fitness.  Maintain a health body weight.  Avoid stress on the plantar fascia.  Wear properly fitted shoes, including arch supports for individuals who have flat feet.  PROGNOSIS  If treated properly, then the symptoms of plantar fasciitis usually resolve without surgery. However, occasionally surgery is necessary.  RELATED COMPLICATIONS   Recurrent symptoms that may result in a chronic condition.  Problems of the lower back that are caused by compensating for the injury, such as limping.  Pain or weakness of the foot during push-off following surgery.  Chronic inflammation, scarring, and partial or complete  fascia tear, occurring more often from repeated injections.  TREATMENT  Treatment initially involves the use of ice and medication to help reduce pain and inflammation. The use of strengthening and stretching exercises may help reduce pain with activity, especially stretches of the Achilles tendon. These exercises may be performed at home or with a therapist. Your caregiver may recommend that you use heel cups of arch supports to help reduce stress on the plantar fascia. Occasionally, corticosteroid injections are given to reduce inflammation. If symptoms persist for greater than 6 months despite non-surgical (conservative), then surgery may be recommended.   MEDICATION   If pain medication is necessary, then nonsteroidal anti-inflammatory medications, such as aspirin and ibuprofen, or other minor pain relievers, such as acetaminophen, are often recommended.  Do not take pain medication within 7 days before surgery.  Prescription pain relievers may be given if deemed necessary by your caregiver. Use only as directed and only as much as you need.  Corticosteroid injections may be given by your caregiver. These injections should be reserved for the most serious cases, because they may only be given a certain number of times.  HEAT AND COLD  Cold treatment (icing) relieves pain and reduces inflammation. Cold treatment should be applied for 10 to 15 minutes every 2 to 3 hours for inflammation and pain and immediately after any activity that aggravates your symptoms. Use ice packs or massage the area with a piece of ice (ice massage).  Heat treatment may be used prior to performing the stretching and strengthening activities prescribed by your caregiver, physical therapist, or athletic trainer. Use a heat pack or soak the injury in warm water.  SEEK IMMEDIATE MEDICAL   CARE IF:  Treatment seems to offer no benefit, or the condition worsens.  Any medications produce adverse side effects.   EXERCISES- RANGE OF MOTION (ROM) AND STRETCHING EXERCISES - Plantar Fasciitis (Heel Spur Syndrome) These exercises may help you when beginning to rehabilitate your injury. Your symptoms may resolve with or without further involvement from your physician, physical therapist or athletic trainer. While completing these exercises, remember:   Restoring tissue flexibility helps normal motion to return to the joints. This allows healthier, less painful movement and activity.  An effective stretch should be held for at least 30 seconds.  A stretch should never be painful. You should only feel a gentle lengthening or release in the stretched tissue.  RANGE OF MOTION - Toe Extension, Flexion  Sit with your right / left leg crossed over your opposite knee.  Grasp your toes and gently pull them back toward the top of your foot. You should feel a stretch on the bottom of your toes and/or foot.  Hold this stretch for 10 seconds.  Now, gently pull your toes toward the bottom of your foot. You should feel a stretch on the top of your toes and or foot.  Hold this stretch for 10 seconds. Repeat  times. Complete this stretch 3 times per day.   RANGE OF MOTION - Ankle Dorsiflexion, Active Assisted  Remove shoes and sit on a chair that is preferably not on a carpeted surface.  Place right / left foot under knee. Extend your opposite leg for support.  Keeping your heel down, slide your right / left foot back toward the chair until you feel a stretch at your ankle or calf. If you do not feel a stretch, slide your bottom forward to the edge of the chair, while still keeping your heel down.  Hold this stretch for 10 seconds. Repeat 3 times. Complete this stretch 2 times per day.   STRETCH  Gastroc, Standing  Place hands on wall.  Extend right / left leg, keeping the front knee somewhat bent.  Slightly point your toes inward on your back foot.  Keeping your right / left heel on the floor and your  knee straight, shift your weight toward the wall, not allowing your back to arch.  You should feel a gentle stretch in the right / left calf. Hold this position for 10 seconds. Repeat 3 times. Complete this stretch 2 times per day.  STRETCH  Soleus, Standing  Place hands on wall.  Extend right / left leg, keeping the other knee somewhat bent.  Slightly point your toes inward on your back foot.  Keep your right / left heel on the floor, bend your back knee, and slightly shift your weight over the back leg so that you feel a gentle stretch deep in your back calf.  Hold this position for 10 seconds. Repeat 3 times. Complete this stretch 2 times per day.  STRETCH  Gastrocsoleus, Standing  Note: This exercise can place a lot of stress on your foot and ankle. Please complete this exercise only if specifically instructed by your caregiver.   Place the ball of your right / left foot on a step, keeping your other foot firmly on the same step.  Hold on to the wall or a rail for balance.  Slowly lift your other foot, allowing your body weight to press your heel down over the edge of the step.  You should feel a stretch in your right / left calf.  Hold this   position for 10 seconds.  Repeat this exercise with a slight bend in your right / left knee. Repeat 3 times. Complete this stretch 2 times per day.   STRENGTHENING EXERCISES - Plantar Fasciitis (Heel Spur Syndrome)  These exercises may help you when beginning to rehabilitate your injury. They may resolve your symptoms with or without further involvement from your physician, physical therapist or athletic trainer. While completing these exercises, remember:   Muscles can gain both the endurance and the strength needed for everyday activities through controlled exercises.  Complete these exercises as instructed by your physician, physical therapist or athletic trainer. Progress the resistance and repetitions only as guided.  STRENGTH -  Towel Curls  Sit in a chair positioned on a non-carpeted surface.  Place your foot on a towel, keeping your heel on the floor.  Pull the towel toward your heel by only curling your toes. Keep your heel on the floor. Repeat 3 times. Complete this exercise 2 times per day.  STRENGTH - Ankle Inversion  Secure one end of a rubber exercise band/tubing to a fixed object (table, pole). Loop the other end around your foot just before your toes.  Place your fists between your knees. This will focus your strengthening at your ankle.  Slowly, pull your big toe up and in, making sure the band/tubing is positioned to resist the entire motion.  Hold this position for 10 seconds.  Have your muscles resist the band/tubing as it slowly pulls your foot back to the starting position. Repeat 3 times. Complete this exercises 2 times per day.  Document Released: 06/22/2005 Document Revised: 09/14/2011 Document Reviewed: 10/04/2008 [employer] Patient Information 2014 [employer]. 

## 2022-01-23 ENCOUNTER — Ambulatory Visit (INDEPENDENT_AMBULATORY_CARE_PROVIDER_SITE_OTHER): Payer: Managed Care, Other (non HMO)

## 2022-01-23 DIAGNOSIS — M779 Enthesopathy, unspecified: Secondary | ICD-10-CM | POA: Diagnosis not present

## 2022-01-23 DIAGNOSIS — M722 Plantar fascial fibromatosis: Secondary | ICD-10-CM | POA: Diagnosis not present

## 2022-01-23 NOTE — Progress Notes (Signed)
Patient presents today to be casted for custom molded orthotics. Dr. Ardelle Anton has been treating patient for capsulitis.   Impression foam cast was taken. ABN signed.  Patient info-  Shoe size: 8 women's wide  Shoe style: alethic shoes  Height: 5'4"  Weight: 165lb    Patient will be notified once orthotics arrive in office and reappoint for fitting at that time.

## 2022-02-11 ENCOUNTER — Ambulatory Visit (INDEPENDENT_AMBULATORY_CARE_PROVIDER_SITE_OTHER): Payer: Managed Care, Other (non HMO)

## 2022-02-11 DIAGNOSIS — M779 Enthesopathy, unspecified: Secondary | ICD-10-CM

## 2022-02-11 DIAGNOSIS — M722 Plantar fascial fibromatosis: Secondary | ICD-10-CM

## 2022-02-11 NOTE — Progress Notes (Signed)
Patient presents today to pick up custom molded foot orthotics, diagnosed with plantar fasciitis and capsulitis by Dr. Ardelle Anton.   Orthotics were dispensed and fit was satisfactory. Reviewed instructions for break-in and wear. Written instructions given to patient.  Patient will follow up as needed.   [facility] - order # [id number]

## 2022-06-25 ENCOUNTER — Other Ambulatory Visit: Payer: Self-pay | Admitting: Internal Medicine

## 2022-06-25 DIAGNOSIS — Z1231 Encounter for screening mammogram for malignant neoplasm of breast: Secondary | ICD-10-CM

## 2022-07-03 ENCOUNTER — Ambulatory Visit
Admission: RE | Admit: 2022-07-03 | Discharge: 2022-07-03 | Disposition: A | Discharge status: Home / Self Care | Payer: Managed Care, Other (non HMO) | Source: Ambulatory Visit | Attending: Internal Medicine | Admitting: Internal Medicine

## 2022-07-03 DIAGNOSIS — Z1231 Encounter for screening mammogram for malignant neoplasm of breast: Secondary | ICD-10-CM

## 2022-07-06 DIAGNOSIS — N950 Postmenopausal bleeding: Secondary | ICD-10-CM

## 2022-07-06 HISTORY — DX: Postmenopausal bleeding: N95.0

## 2022-08-27 NOTE — H&P (Signed)
Tanya Wall is an 57 y.o. postmenopausal G1P1 who is admitted for Hysteroscopy with Dilation and Curettage with Myosure for postmenopausal bleeding.  Patient initally presented 06/2022 for further discussion of HRT (currently on Estradiol 0.'05mg'$ /24h patch twice weekly and Prometrium '200mg'$  nightly). She reports several episodes of PMB has had dark brown/red clots that were smaller than a dime. States she has had breakthrough bleeding over the last 1-2 years and though this was normal. Of note, patient has one ovary s/p left oophorectomy for ectopic pregnancy.  Cervical polyp was noted on 07/17/2022 at the time of EMB. Unfortunately, this EMB did not contain any endometrial tissue. Prior to procedure, patient desired repeat EMB sampling which revealed only rare scattered benign glands of endometrial origin.  Work-up: EMB (08/17/2022): Predominately benign endocervical tissue. Rare scattered benign glands of endometrial origin seen.         EMB (07/17/2022): Benign endocervical tissue. No endometrial tissue identified. Cervical polypectomy - Benign endocervical polyp          TVUS (07/07/2022):    Uterus: 7.63 x 3.75 x 4.68 cm         Endometrial thickness: 0.43 cm         Fibroid 1 1.37 cm  Fibroid 2 1.72 cm         Right Ovary 1.85 cm         Retroverted uterus. Uterine fibroids - posterior fundal 1.1 x 1.0 x 1.4 cm, posterior 1.6 x 1.4 x 1.7 cm. Inhomogenous echotexture throughout uterus. Endometrium WNL. Right Ovary WNL. Left ovary surgically removed. No free fluid or adnexal masses seen throughout.  Pap smear (2021): NILM/HRHPV negative  Patient Active Problem List   Diagnosis Date Noted   Postmenopausal bleeding 09/02/2022   S/P left knee arthroscopy 09/24/2015    MEDICAL/FAMILY/SOCIAL HX: No LMP recorded. Patient is postmenopausal.    Past Medical History:  Diagnosis Date   OSA (obstructive sleep apnea)    Postmenopausal bleeding     Past Surgical History:  Procedure  Laterality Date   ARTHROSCOPIC REPAIR ACL Left    salpino-oophorectomy Left     Family History  Problem Relation Age of Onset   Breast cancer Paternal Aunt 25    Social History:  reports that she has never smoked. She has never used smokeless tobacco. She reports current alcohol use. No history on file for drug use.  ALLERGIES/MEDS:  Allergies: No Known Allergies  No medications prior to admission.     Review of Systems  Constitutional: Negative.   HENT: Negative.    Eyes: Negative.   Respiratory: Negative.    Cardiovascular: Negative.   Gastrointestinal: Negative.   Genitourinary: Negative.   Musculoskeletal: Negative.   Skin: Negative.   Neurological: Negative.   Endo/Heme/Allergies: Negative.   Psychiatric/Behavioral: Negative.      There were no vitals taken for this visit. Gen:  NAD, pleasant and cooperative Cardio:  RRR Pulm:  CTAB, no wheezes/rales/rhonchi Abd:  Soft, non-distended, non-tender throughout, no rebound/guarding Ext:  No bilateral LE edema, no bilateral calf tenderness Pelvic: Labia - unremarkable, vagina - pink moist mucosa, no lesions or abnormal discharge, cervix - no discharge or lesions or CMT  No results found for this or any previous visit (from the past 24 hour(s)).  No results found.   ASSESSMENT/PLAN: Metzli Chen-De Yolanda Wall is a 57 y.o. postmenopausal G1P1 who is admitted for Hysteroscopy with Dilation and Curettage with Myosure for postmenopausal bleeding.  - Admit to [facility] labs (  CBC, T&S, COVID screen per protocol) - Diet:  ERAS pathway/per anesthesia - IVF:  Per anestheisa - VTE Prophylaxis:  SCDs - Antibiotics: None - D/C home same-day  Consents: I discussed with the patient that this surgery is performed to look inside the uterus and remove the uterine lining.  Prior to surgery, the risks and benefits of the surgery, as well as alternative treatments, have been discussed.  The risks include, but are not limited  to bleeding, including the need for a blood transfusion, infection, damage to organs and tissues, including uterine perforation, requiring additional surgery, postoperative pain, short-term and long-term, failure of the procedure to control symptoms, need for hysterectomy to control bleeding, fluid overload, which could create electrolyte abnormalities and the need to stop the procedure before completion, inability to safely complete the procedure, deep vein thrombosis and/or pulmonary embolism, painful intercourse, complications the course of which cannot be predicted or prevented, and death.  Patient was consented for blood products.  The patient is aware that bleeding may result in the need for a blood transfusion which includes risk of transmission of HIV (1:2 million), Hepatitis C (1:2 million), and Hepatitis B (1:200 thousand) and transfusion reaction.  Patient voiced understanding of the above risks as well as understanding of indications for blood transfusion.   [name], DO

## 2022-09-02 ENCOUNTER — Encounter: Payer: Self-pay | Admitting: Obstetrics and Gynecology

## 2022-09-02 DIAGNOSIS — N950 Postmenopausal bleeding: Secondary | ICD-10-CM | POA: Insufficient documentation

## 2022-09-08 ENCOUNTER — Encounter (HOSPITAL_COMMUNITY): Payer: Self-pay | Admitting: Obstetrics and Gynecology

## 2022-09-08 NOTE — Progress Notes (Signed)
PCP - Dr Leeroy Cha  Cardiologist - n/a  Chest x-ray - n/a EKG - n/a Stress Test - n/a ECHO - n/a Cardiac Cath - n/a  ICD Pacemaker/Loop - n/a  Sleep Study -  Yes CPAP - does not use CPAP  Diabetes - n/a  ERAS: Clear liquids til 9:30 AM DOS.  Anesthesia review: No  STOP now taking any Aspirin (unless otherwise instructed by your surgeon), Aleve, Naproxen, Ibuprofen, Motrin, Advil, Goody's, BC's, all herbal medications, fish oil, and all vitamins.   Coronavirus Screening Do you have any of the following symptoms:  Cough yes/no: No Fever (>100.74F)  yes/no: No Runny nose yes/no: No Sore throat yes/no: No Difficulty breathing/shortness of breath  yes/no: No  Have you traveled in the last 14 days and where? yes/no: No  Patient verbalized understanding of instructions that were given via phone.

## 2022-09-10 ENCOUNTER — Encounter (HOSPITAL_COMMUNITY)
Admission: RE | Disposition: A | Discharge status: Home / Self Care | Payer: Self-pay | Source: Home / Self Care | Attending: Obstetrics and Gynecology

## 2022-09-10 ENCOUNTER — Other Ambulatory Visit: Payer: Self-pay

## 2022-09-10 ENCOUNTER — Ambulatory Visit (HOSPITAL_BASED_OUTPATIENT_CLINIC_OR_DEPARTMENT_OTHER): Payer: Managed Care, Other (non HMO) | Admitting: Anesthesiology

## 2022-09-10 ENCOUNTER — Ambulatory Visit (HOSPITAL_COMMUNITY)
Admission: RE | Admit: 2022-09-10 | Discharge: 2022-09-10 | Disposition: A | Discharge status: Home / Self Care | Payer: Managed Care, Other (non HMO) | Attending: Obstetrics and Gynecology | Admitting: Obstetrics and Gynecology

## 2022-09-10 ENCOUNTER — Ambulatory Visit (HOSPITAL_COMMUNITY): Payer: Managed Care, Other (non HMO) | Admitting: Anesthesiology

## 2022-09-10 ENCOUNTER — Encounter (HOSPITAL_COMMUNITY): Payer: Self-pay | Admitting: Obstetrics and Gynecology

## 2022-09-10 DIAGNOSIS — N950 Postmenopausal bleeding: Secondary | ICD-10-CM | POA: Insufficient documentation

## 2022-09-10 DIAGNOSIS — Z780 Asymptomatic menopausal state: Secondary | ICD-10-CM | POA: Diagnosis not present

## 2022-09-10 DIAGNOSIS — G4733 Obstructive sleep apnea (adult) (pediatric): Secondary | ICD-10-CM | POA: Diagnosis not present

## 2022-09-10 DIAGNOSIS — I2510 Atherosclerotic heart disease of native coronary artery without angina pectoris: Secondary | ICD-10-CM

## 2022-09-10 DIAGNOSIS — Z01818 Encounter for other preprocedural examination: Secondary | ICD-10-CM

## 2022-09-10 DIAGNOSIS — R9389 Abnormal findings on diagnostic imaging of other specified body structures: Secondary | ICD-10-CM | POA: Diagnosis present

## 2022-09-10 HISTORY — DX: Hyperlipidemia, unspecified: E78.5

## 2022-09-10 HISTORY — DX: Cardiac murmur, unspecified: R01.1

## 2022-09-10 HISTORY — DX: Obstructive sleep apnea (adult) (pediatric): G47.33

## 2022-09-10 HISTORY — PX: DILATATION & CURETTAGE/HYSTEROSCOPY WITH MYOSURE: SHX6511

## 2022-09-10 HISTORY — DX: Herpesviral infection, unspecified: B00.9

## 2022-09-10 HISTORY — DX: Postmenopausal bleeding: N95.0

## 2022-09-10 LAB — CBC
HCT: 41.5 % (ref 36.0–46.0)
Hemoglobin: 13.9 g/dL (ref 12.0–15.0)
MCH: 29.4 pg (ref 26.0–34.0)
MCHC: 33.5 g/dL (ref 30.0–36.0)
MCV: 87.7 fL (ref 80.0–100.0)
Platelets: 289 10*3/uL (ref 150–400)
RBC: 4.73 MIL/uL (ref 3.87–5.11)
RDW: 12 % (ref 11.5–15.5)
WBC: 5.8 10*3/uL (ref 4.0–10.5)
nRBC: 0 % (ref 0.0–0.2)

## 2022-09-10 LAB — TYPE AND SCREEN
ABO/RH(D): AB POS
Antibody Screen: NEGATIVE

## 2022-09-10 LAB — ABO/RH: ABO/RH(D): AB POS

## 2022-09-10 SURGERY — DILATATION & CURETTAGE/HYSTEROSCOPY WITH MYOSURE
Anesthesia: General

## 2022-09-10 MED ORDER — LACTATED RINGERS IV SOLN: INTRAVENOUS | Status: DC

## 2022-09-10 MED ORDER — SILVER NITRATE-POT NITRATE 75-25 % EX MISC
CUTANEOUS | Status: DC | PRN
  Administered 2022-09-10: 2

## 2022-09-10 MED ORDER — PROPOFOL 500 MG/50ML IV EMUL
INTRAVENOUS | Status: DC | PRN
  Administered 2022-09-10: 150 ug/kg/min via INTRAVENOUS

## 2022-09-10 MED ORDER — AMISULPRIDE (ANTIEMETIC) 5 MG/2ML IV SOLN: 10.0000 mg | Freq: Once | INTRAVENOUS | Status: DC | PRN

## 2022-09-10 MED ORDER — CHLORHEXIDINE GLUCONATE 0.12 % MT SOLN: 15.0000 mL | Freq: Once | OROMUCOSAL | Status: AC

## 2022-09-10 MED ORDER — CHLORHEXIDINE GLUCONATE 0.12 % MT SOLN
OROMUCOSAL | Status: AC
  Administered 2022-09-10: 15 mL via OROMUCOSAL
  Filled 2022-09-10: qty 15

## 2022-09-10 MED ORDER — PROPOFOL 10 MG/ML IV BOLUS
INTRAVENOUS | Status: AC
  Filled 2022-09-10: qty 20

## 2022-09-10 MED ORDER — ONDANSETRON HCL 4 MG/2ML IJ SOLN
INTRAMUSCULAR | Status: DC | PRN
  Administered 2022-09-10: 4 mg via INTRAVENOUS

## 2022-09-10 MED ORDER — PROPOFOL 10 MG/ML IV BOLUS
INTRAVENOUS | Status: DC | PRN
  Administered 2022-09-10: 200 mg via INTRAVENOUS

## 2022-09-10 MED ORDER — DEXAMETHASONE SODIUM PHOSPHATE 10 MG/ML IJ SOLN
INTRAMUSCULAR | Status: DC | PRN
  Administered 2022-09-10: 5 mg via INTRAVENOUS

## 2022-09-10 MED ORDER — SODIUM CHLORIDE 0.9 % IR SOLN
Status: DC | PRN
  Administered 2022-09-10: 3000 mL

## 2022-09-10 MED ORDER — LIDOCAINE 2% (20 MG/ML) 5 ML SYRINGE
INTRAMUSCULAR | Status: DC | PRN
  Administered 2022-09-10: 60 mg via INTRAVENOUS

## 2022-09-10 MED ORDER — FENTANYL CITRATE (PF) 250 MCG/5ML IJ SOLN
INTRAMUSCULAR | Status: AC
  Filled 2022-09-10: qty 5

## 2022-09-10 MED ORDER — SCOPOLAMINE 1 MG/3DAYS TD PT72
1.0000 | MEDICATED_PATCH | TRANSDERMAL | Status: DC
  Administered 2022-09-10: 1.5 mg via TRANSDERMAL
  Filled 2022-09-10: qty 1

## 2022-09-10 MED ORDER — ORAL CARE MOUTH RINSE: 15.0000 mL | Freq: Once | OROMUCOSAL | Status: AC

## 2022-09-10 MED ORDER — FENTANYL CITRATE (PF) 250 MCG/5ML IJ SOLN
INTRAMUSCULAR | Status: DC | PRN
  Administered 2022-09-10: 50 ug via INTRAVENOUS

## 2022-09-10 MED ORDER — ACETAMINOPHEN 500 MG PO TABS
1000.0000 mg | ORAL_TABLET | Freq: Once | ORAL | Status: AC
  Administered 2022-09-10: 1000 mg via ORAL
  Filled 2022-09-10: qty 2

## 2022-09-10 MED ORDER — FENTANYL CITRATE (PF) 100 MCG/2ML IJ SOLN: 25.0000 ug | INTRAMUSCULAR | Status: DC | PRN

## 2022-09-10 MED ORDER — MIDAZOLAM HCL 2 MG/2ML IJ SOLN
INTRAMUSCULAR | Status: DC | PRN
  Administered 2022-09-10: 2 mg via INTRAVENOUS

## 2022-09-10 MED ORDER — MIDAZOLAM HCL 2 MG/2ML IJ SOLN
INTRAMUSCULAR | Status: AC
  Filled 2022-09-10: qty 2

## 2022-09-10 SURGICAL SUPPLY — 17 items
CATH ROBINSON RED A/P 16FR (CATHETERS) ×1 IMPLANT
CNTNR URN SCR LID CUP LEK RST (MISCELLANEOUS) ×1 IMPLANT
CONT SPEC 4OZ STRL OR WHT (MISCELLANEOUS) ×1
DEVICE MYOSURE LITE (MISCELLANEOUS) IMPLANT
DEVICE MYOSURE REACH (MISCELLANEOUS) IMPLANT
DILATOR CANAL MILEX (MISCELLANEOUS) IMPLANT
GLOVE BIO SURGEON STRL SZ 6.5 (GLOVE) ×1 IMPLANT
GLOVE BIOGEL PI IND STRL 7.0 (GLOVE) ×2 IMPLANT
GOWN STRL REUS W/ TWL LRG LVL3 (GOWN DISPOSABLE) ×2 IMPLANT
GOWN STRL REUS W/TWL LRG LVL3 (GOWN DISPOSABLE) ×2
KIT PROCEDURE FLUENT (KITS) ×1 IMPLANT
KIT TURNOVER KIT B (KITS) ×1 IMPLANT
PACK VAGINAL MINOR WOMEN LF (CUSTOM PROCEDURE TRAY) ×1 IMPLANT
PAD OB MATERNITY 4.3X12.25 (PERSONAL CARE ITEMS) ×1 IMPLANT
SEAL ROD LENS SCOPE MYOSURE (ABLATOR) ×1 IMPLANT
TOWEL GREEN STERILE FF (TOWEL DISPOSABLE) ×1 IMPLANT
UNDERPAD 30X36 HEAVY ABSORB (UNDERPADS AND DIAPERS) ×1 IMPLANT

## 2022-09-10 NOTE — Anesthesia Preprocedure Evaluation (Signed)
Anesthesia Evaluation  Patient identified by MRN, date of birth, ID band Patient awake    Reviewed: Allergy & Precautions, NPO status , Patient's Chart, lab work & pertinent test results  History of Anesthesia Complications (+) PONV and history of anesthetic complications  Airway Mallampati: II  TM Distance: >3 FB Neck ROM: Full    Dental   Pulmonary sleep apnea    breath sounds clear to auscultation       Cardiovascular negative cardio ROS  Rhythm:Regular Rate:Normal     Neuro/Psych negative neurological ROS     GI/Hepatic negative GI ROS, Neg liver ROS,,,  Endo/Other  negative endocrine ROS    Renal/GU negative Renal ROS     Musculoskeletal   Abdominal   Peds  Hematology negative hematology ROS (+)   Anesthesia Other Findings   Reproductive/Obstetrics                             Anesthesia Physical Anesthesia Plan  ASA: 2  Anesthesia Plan: General   Post-op Pain Management: Tylenol PO (pre-op)* and Toradol IV (intra-op)*   Induction: Intravenous  PONV Risk Score and Plan: 4 or greater and Scopolamine patch - Pre-op, Midazolam, Propofol infusion, TIVA, Dexamethasone, Ondansetron and Treatment may vary due to age or medical condition  Airway Management Planned: LMA  Additional Equipment:   Intra-op Plan:   Post-operative Plan: Extubation in OR  Informed Consent: I have reviewed the patients History and Physical, chart, labs and discussed the procedure including the risks, benefits and alternatives for the proposed anesthesia with the patient or authorized representative who has indicated his/her understanding and acceptance.     Dental advisory given  Plan Discussed with: CRNA  Anesthesia Plan Comments:        Anesthesia Quick Evaluation

## 2022-09-10 NOTE — Transfer of Care (Signed)
Immediate Anesthesia Transfer of Care Note  Patient: Waylynn Chen-De Yolanda Bonine  Procedure(s) Performed: DILATATION & CURETTAGE/HYSTEROSCOPY WITH MYOSURE  Patient Location: PACU  Anesthesia Type:General  Level of Consciousness: awake and patient cooperative  Airway & Oxygen Therapy: Patient Spontanous Breathing and Patient connected to face mask oxygen  Post-op Assessment: Report given to RN, Post -op Vital signs reviewed and stable, and Patient moving all extremities  Post vital signs: Reviewed and stable  Last Vitals:  Vitals Value Taken Time  BP 121/81 1348 09/10/2022  Temp    Pulse 60 1348 09/10/2022  Resp 18 1348 09/10/2022  SpO2 100 1348 09/10/2022    Last Pain:  Vitals:   09/10/22 1043  TempSrc:   PainSc: 0-No pain         Complications: No notable events documented.

## 2022-09-10 NOTE — Anesthesia Procedure Notes (Signed)
Procedure Name: LMA Insertion Date/Time: 09/10/2022 12:52 PM  Performed by: Elvin So, CRNAPre-anesthesia Checklist: Patient identified, Emergency Drugs available, Suction available and Patient being monitored Patient Re-evaluated:Patient Re-evaluated prior to induction Oxygen Delivery Method: Circle System Utilized Preoxygenation: Pre-oxygenation with 100% oxygen Induction Type: IV induction Ventilation: Mask ventilation without difficulty LMA: LMA inserted LMA Size: 4.0 Number of attempts: 1 Airway Equipment and Method: Bite block Placement Confirmation: positive ETCO2 and ETT inserted through vocal cords under direct vision Tube secured with: Tape Dental Injury: Teeth and Oropharynx as per pre-operative assessment

## 2022-09-10 NOTE — Interval H&P Note (Signed)
History and Physical Interval Note:  09/10/2022 12:29 PM  Tanya Wall-De Yolanda Bonine  has presented today for surgery, with the diagnosis of Postmenopausal Bleeding.  The various methods of treatment have been discussed with the patient and family. After consideration of risks, benefits and other options for treatment, the patient has consented to  Procedure(s) with comments: [place] (N/A) - rep will be here confirmed on 2/26 CS as a surgical intervention.  The patient's history has been reviewed, patient examined, no change in status, stable for surgery.  I have reviewed the patient's chart and labs.  Questions were answered to the patient's satisfaction.     [name]

## 2022-09-10 NOTE — Interval H&P Note (Signed)
History and Physical Interval Note:  09/10/2022 12:30 PM  Tanya Wall-De Yolanda Bonine  has presented today for surgery, with the diagnosis of Postmenopausal Bleeding.  The various methods of treatment have been discussed with the patient and family. After consideration of risks, benefits and other options for treatment, the patient has consented to  Procedure(s) with comments: [place] (N/A) - rep will be here confirmed on 2/26 CS as a surgical intervention.  The patient's history has been reviewed, patient examined, no change in status, stable for surgery.  I have reviewed the patient's chart and labs.  Questions were answered to the patient's satisfaction.     [name]

## 2022-09-10 NOTE — Op Note (Signed)
Pre Op Dx:   1. Postmenopausal bleeding 2. Thickened endometrium on ultrasound (0.43cm)  Post Op Dx:   Same as pre-operative diagnoses  Procedure:   Hysteroscopy with Dilation and Curettage using Myosure   Surgeon:  Dr. [name] Assistants:  None Anesthesia:  LMA   EBL:  10cc  IVF:  See anesthesia documentation UOP:  Voided prior to arrival to OR Fluid Deficit:  160cc   Drains:  None Specimen removed:  Endometrial curettings - sent to pathology Device(s) implanted: None Case Type:  Clean-contaminated Findings:  Normal-appearing cervix and endocervical canal. Stenotic internal cervical os. Small endometrial cavity. Proliferative-appearing endometrium. Bilateral tubal ostia visualized. Complications: None Indications:  57 y.o. postmenopausal G1P1 with recurrent episodes of PMB. Most recent EMB revealed rare scattered benign glands of endometrial origin seen.  Description of each procedure:  After informed consent was obtained the patient was taken to the operating room in the dorsal supine position.  After administration of general anesthesia, the patient was placed in the dorsal lithotomy position and prepped and draped in the usual sterile fashion. A pre-operative time-out was completed.  The anterior lip of the cervix was grasped with a single-tooth tenaculum and the cervix was serially dilated to accommodate the hysteroscope. The diagnostic scope was used to evaluate the endometrium due to stenotic internal cervical os. The internal os was further dilated and it accommodated the operative hysteroscope.  The hysteroscope was advanced and the findings as above was noted. The Myosure Reach was used to resect the endometrium throughout.  A sharp banjo curette was used to curettage the endometrium. The single-tooth tenaculum was removed and its sites were made hemostatic with silver nitrate.  Adequate hemostasis was noted.  The patient was awakened and extubated and appeared to have  tolerated the procedure well.  All counts were correct.  Disposition:  PACU  [name], DO

## 2022-09-11 ENCOUNTER — Encounter (HOSPITAL_COMMUNITY): Payer: Self-pay | Admitting: Obstetrics and Gynecology

## 2022-09-11 LAB — SURGICAL PATHOLOGY

## 2022-09-11 NOTE — Anesthesia Postprocedure Evaluation (Signed)
Anesthesia Post Note  Patient: Tanya Wall-De Yolanda Bonine  Procedure(s) Performed: [place]     Patient location during evaluation: PACU Anesthesia Type: General Level of consciousness: awake and alert Pain management: pain level controlled Vital Signs Assessment: post-procedure vital signs reviewed and stable Respiratory status: spontaneous breathing, nonlabored ventilation, respiratory function stable and patient connected to nasal cannula oxygen Cardiovascular status: blood pressure returned to baseline and stable Postop Assessment: no apparent nausea or vomiting Anesthetic complications: no   No notable events documented.  Last Vitals:  Vitals:   09/10/22 1400 09/10/22 1415  BP: (!) 149/88 (!) 155/87  Pulse: 61 62  Resp: 14 14  Temp:  36.7 C  SpO2: 100% 99%    Last Pain:  Vitals:   09/10/22 1415  TempSrc:   PainSc: 0-No pain   Pain Goal:                   Tiajuana Amass

## 2022-12-30 ENCOUNTER — Emergency Department (HOSPITAL_COMMUNITY)
Admission: EM | Admit: 2022-12-30 | Discharge: 2022-12-31 | Disposition: A | Discharge status: Home / Self Care | Payer: Managed Care, Other (non HMO) | Attending: Emergency Medicine | Admitting: Emergency Medicine

## 2022-12-30 ENCOUNTER — Encounter (HOSPITAL_BASED_OUTPATIENT_CLINIC_OR_DEPARTMENT_OTHER): Payer: Self-pay | Admitting: Obstetrics and Gynecology

## 2022-12-30 ENCOUNTER — Other Ambulatory Visit: Payer: Self-pay

## 2022-12-30 DIAGNOSIS — R55 Syncope and collapse: Secondary | ICD-10-CM | POA: Insufficient documentation

## 2022-12-30 DIAGNOSIS — I10 Essential (primary) hypertension: Secondary | ICD-10-CM | POA: Insufficient documentation

## 2022-12-30 LAB — CBC
HCT: 37.7 % (ref 36.0–46.0)
Hemoglobin: 12.1 g/dL (ref 12.0–15.0)
MCH: 28.7 pg (ref 26.0–34.0)
MCHC: 32.1 g/dL (ref 30.0–36.0)
MCV: 89.3 fL (ref 80.0–100.0)
Platelets: 269 10*3/uL (ref 150–400)
RBC: 4.22 MIL/uL (ref 3.87–5.11)
RDW: 12 % (ref 11.5–15.5)
WBC: 8.9 10*3/uL (ref 4.0–10.5)
nRBC: 0 % (ref 0.0–0.2)

## 2022-12-30 LAB — BASIC METABOLIC PANEL
Anion gap: 15 (ref 5–15)
BUN: 13 mg/dL (ref 6–20)
CO2: 21 mmol/L — ABNORMAL LOW (ref 22–32)
Calcium: 8.4 mg/dL — ABNORMAL LOW (ref 8.9–10.3)
Chloride: 102 mmol/L (ref 98–111)
Creatinine, Ser: 0.75 mg/dL (ref 0.44–1.00)
GFR, Estimated: >60 mL/min (ref >60)
Glucose, Bld: 80 mg/dL (ref 70–99)
Potassium: 3.4 mmol/L — ABNORMAL LOW (ref 3.5–5.1)
Sodium: 138 mmol/L (ref 135–145)

## 2022-12-30 LAB — HCG, SERUM, QUALITATIVE: Preg, Serum: NEGATIVE

## 2022-12-30 MED ORDER — LACTATED RINGERS IV BOLUS
1000.0000 mL | Freq: Once | INTRAVENOUS | Status: AC
  Administered 2022-12-30: 1000 mL via INTRAVENOUS

## 2022-12-30 NOTE — ED Provider Notes (Signed)
[place] EMERGENCY DEPARTMENT AT [facility] Provider Note   CSN: [id number] Arrival date & time: 12/30/22  2019     History  No chief complaint on file.   Tanya Wall is a 57 y.o. female.  HPI  Patient is a 57 year old female with past medical history significant for postmenopausal bleeding scheduled for hysterectomy has been this checked for endometrial cancer by OB/GYN and found to be negative, history of HTN, HLD, heart murmur since childhood  She presents emergency room today with complaints of a episode of syncope.  Seems that she was seen at her primary care office today and fasted throughout the day.  She states that she had a rice cake and then went to see a movie with her friend and a small amount of popcorn.  She then went to a restaurant where she had a glass of wine and then had an episode where she felt very lightheaded nauseous and unwell and then passed out.  According to bystanders she briefly jerked on the ground however when she returned to consciousness she was not confused or sluggish.  She did not have any bowel or bladder incontinence no tongue biting she has no history of seizures.  She states that she felt somewhat weird and had 1 or 2 episodes of vomiting that was nonbloody nonbilious and then started to feel better.  She was given 4 mg of Zofran by EMS this with resolution of her symptoms.  She is now at this time asymptomatic.  No recent surgeries, hospitalization, long travel, hemoptysis, cancer history.  No unilateral leg swelling.  No history of PE or VTE.      Home Medications Prior to Admission medications   Medication Sig Start Date End Date Taking? Authorizing Provider  BIOTIN PO Take 1 tablet by mouth daily.    [provider]  citalopram (CELEXA) 40 MG tablet Take 40 mg by mouth at bedtime.  12/08/18   [provider]  estradiol (VIVELLE-DOT) 0.05 MG/24HR patch Place 1 patch onto the skin 2 (two) times a week.     [provider]  progesterone (PROMETRIUM) 200 MG capsule Take 200 mg by mouth at bedtime.    [provider]  valACYclovir (VALTREX) 1000 MG tablet Take 1,000 mg by mouth 2 (two) times daily as needed (outbreaks). HSV - take for 3-5 days    [provider]      Allergies    Promethazine    Review of Systems   Review of Systems  Physical Exam Updated Vital Signs BP 139/85   Pulse 69   Temp 98.1 F (36.7 C) (Oral)   Resp 15   SpO2 100%  Physical Exam Vitals and nursing note reviewed.  Constitutional:      General: She is not in acute distress. HENT:     Head: Normocephalic and atraumatic.     Nose: Nose normal.     Mouth/Throat:     Comments: Slightly dry oral mucosa Eyes:     General: No scleral icterus. Cardiovascular:     Rate and Rhythm: Normal rate and regular rhythm.     Pulses: Normal pulses.     Heart sounds: Normal heart sounds.  Pulmonary:     Effort: Pulmonary effort is normal. No respiratory distress.     Breath sounds: No wheezing.  Abdominal:     Palpations: Abdomen is soft.     Tenderness: There is no abdominal tenderness. There is no guarding or rebound.  Musculoskeletal:     Cervical back: Normal range of motion.     Right lower leg: No edema.     Left lower leg: No edema.  Skin:    General: Skin is warm and dry.     Capillary Refill: Capillary refill takes less than 2 seconds.  Neurological:     Mental Status: She is alert. Mental status is at baseline.     Comments: Alert and oriented to self, place, time and event.   Speech is fluent, clear without dysarthria or dysphasia.   Strength 5/5 in upper/lower extremities   Sensation intact in upper/lower extremities   Normal gait.  CN I not tested  CN II grossly intact visual fields bilaterally. Did not visualize posterior eye.  CN III, IV, VI PERRLA and EOMs intact bilaterally  CN V Intact sensation to sharp and light touch to the face  CN VII facial movements  symmetric  CN VIII not tested  CN IX, X no uvula deviation, symmetric rise of soft palate  CN XI 5/5 SCM and trapezius strength bilaterally  CN XII Midline tongue protrusion, symmetric L/R movements   Psychiatric:        Mood and Affect: Mood normal.        Behavior: Behavior normal.     ED Results / Procedures / Treatments   Labs (all labs ordered are listed, but only abnormal results are displayed) Labs Reviewed  CBC  BASIC METABOLIC PANEL  HCG, SERUM, QUALITATIVE    EKG None  Radiology No results found.  Procedures Procedures    Medications Ordered in ED Medications  lactated ringers bolus 1,000 mL (1,000 mLs Intravenous New Bag/Given 12/30/22 2154)    ED Course/ Medical Decision Making/ A&P                             Medical Decision Making Amount and/or Complexity of Data Reviewed Labs: ordered.   This patient presents to the ED for concern of syncope, this involves a number of treatment options, and is a complaint that carries with it a moderate risk of complications and morbidity. A differential diagnosis was considered for the patient's symptoms which is discussed below:   The differential diagnosis for syncope includes (but is not limited to) anemia, hypoglycemia, arrhythmia, seizure.  If associated with pain loss of consciousness can certainly also include subarachnoid hemorrhage, thoracic aortic dissection, PE, ectopic pregnancy, AAA.  Also with considering is a vasovagal reaction, hypovolemia/dehydration.    Co morbidities: Discussed in HPI   Brief History:  Patient is a 57 year old female with past medical history significant for postmenopausal bleeding scheduled for hysterectomy has been this checked for endometrial cancer by OB/GYN and found to be negative, history of HTN, HLD, heart murmur since childhood  She presents emergency room today with complaints of a episode of syncope.  Seems that she was seen at her primary care office today and  fasted throughout the day.  She states that she had a rice cake and then went to see a movie with her friend and a small amount of popcorn.  She then went to a restaurant where she had a glass of wine and then had an episode where she felt very lightheaded nauseous and unwell and then passed out.  According to bystanders she briefly jerked on the ground however when she returned to consciousness she was not confused or sluggish.  She did not have any bowel or bladder  incontinence no tongue biting she has no history of seizures.  She states that she felt somewhat weird and had 1 or 2 episodes of vomiting that was nonbloody nonbilious and then started to feel better.  She was given 4 mg of Zofran by EMS this with resolution of her symptoms.  She is now at this time asymptomatic.  No recent surgeries, hospitalization, long travel, hemoptysis, cancer history.  No unilateral leg swelling.  No history of PE or VTE.     EMR reviewed including pt PMHx, past surgical history and past visits to ER.   See HPI for more details   Lab Tests:      Imaging Studies:      Cardiac Monitoring:  The patient was maintained on a cardiac monitor.  I personally viewed and interpreted the cardiac monitored which showed an underlying rhythm of: NSR EKG non-ischemic   Medicines ordered:  I ordered medication including 1L LR  for hydration Reevaluation of the patient after these medicines showed that the patient improved I have reviewed the patients home medicines and have made adjustments as needed   Critical Interventions:     Consults/Attending Physician   I discussed this case with my attending physician who cosigned this note including patient's presenting symptoms, physical exam, and planned diagnostics and interventions. Attending physician stated agreement with plan or made changes to plan which were implemented.     Reevaluation:  After the interventions noted above I re-evaluated  patient and found that they have :improved   Social Determinants of Health:      Problem List / ED Course:  Patient with painless syncope had prodromal symptoms of lightheadedness nausea and feeling like she was going to pass out.  She then passed out had a moment of abnormal movements however did not have a postictal period and is neurologically intact at the very low suspicion that patient experienced a seizure.  She was kept upright when the syncopal episode occurred which is likely why she had some abnormal jerking movements.  She is at her baseline has no symptoms currently.  She is on an estrogen supplement to help with her postmenopausal bleeding which has been evaluated by OB/GYN and found not to be uterine cancer after biopsy.  She has no other risk factors for pulmonary embolism and she had no chest pain and she has normal vital signs.  I had a lengthy shared decision-making oversedation with this patient regarding whether or not to obtain a D-dimer and she states she would prefer not to have additional workup as she feels that given her lack of fluid intake and food today that her syncopal episode occurred as a result of this.  I think this is very reasonable.  Will obtain basic labs to check for anemia, hypoglycemia, provide with fluids and ultimately anticipate discharge if she is still feeling well on recheck.  Patient care handed off to Dr. Clarice Pole 9:59 PM    Dispostion:    Final Clinical Impression(s) / ED Diagnoses Final diagnoses:  Syncope, unspecified syncope type    Rx / DC Orders ED Discharge Orders     None         Gailen Shelter, [place] 12/30/22 2159    Arby Barrette, MD 01/05/23 1324

## 2022-12-30 NOTE — ED Triage Notes (Signed)
Pt to ED via EMS from restaurant ([employer]). Pt reports eating and drinking 1 glass of wine when pt's friend noticed pt had blank stare then LOC without fall (friend held pt up in chair), followed by about 30 seconds of twitching. Pt reports feeling hot and flushed prior to episode. Pt reports vomiting after episode happened. EMS gave 4 of zofran en route. Pt was diaphoretic upon EMS arrival. Pt AAOx4 upon arrival to ED. Pt back to baseline upon arrival to ED. Pt reports not eating much today and not drinking much today because she had blood work drawn earlier today. Pt denies hx of seizures. Pt also states she has been outside in the heat a lot today. Pt denies pain.   EMS Vitals: 128/86 77 HR 96% RA 22 R hand 4 zofran 300cc NS bolus 103 CBG

## 2022-12-30 NOTE — Progress Notes (Signed)
Your procedure is scheduled on Tuesday, 01/19/23.  Report to [facility] AT  8:30 AM.   Call this number if you have problems the morning of surgery  [phone].   OUR ADDRESS IS [address].  WE ARE LOCATED IN [facility].  PLEASE BRING YOUR INSURANCE CARD AND PHOTO ID DAY OF SURGERY.  ONLY 2 PEOPLE ARE ALLOWED IN  WAITING  ROOM                                      REMEMBER:  DO NOT EAT FOOD, CANDY GUM OR MINTS  AFTER MIDNIGHT THE NIGHT BEFORE YOUR SURGERY . YOU MAY HAVE CLEAR LIQUIDS FROM MIDNIGHT THE NIGHT BEFORE YOUR SURGERY UNTIL  7:30 AM. NO CLEAR LIQUIDS AFTER   7:30 AM DAY OF SURGERY.  YOU MAY  BRUSH YOUR TEETH MORNING OF SURGERY AND RINSE YOUR MOUTH OUT, NO CHEWING GUM CANDY OR MINTS.     CLEAR LIQUID DIET    Allowed      Water                                                                   Coffee and tea, regular and decaf  (NO cream or milk products of any type, may sweeten)                         Carbonated beverages, regular and diet                                    Sports drinks like Gatorade _____________________________________________________________________     TAKE ONLY THESE MEDICATIONS MORNING OF SURGERY: Vivelle dot, Valtrex if needed                                        DO NOT WEAR JEWERLY/  METAL/  PIERCINGS (INCLUDING NO PLASTIC PIERCINGS) DO NOT WEAR LOTIONS, POWDERS, PERFUMES OR NAIL POLISH ON YOUR FINGERNAILS. TOENAIL POLISH IS OK TO WEAR. DO NOT SHAVE FOR 48 HOURS PRIOR TO DAY OF SURGERY.  CONTACTS, GLASSES, OR DENTURES MAY NOT BE WORN TO SURGERY.  REMEMBER: NO SMOKING, VAPING ,  DRUGS OR ALCOHOL FOR 24 HOURS BEFORE YOUR SURGERY.                                    [facility] IS NOT RESPONSIBLE  FOR ANY BELONGINGS.                                                                    Marland Kitchen           [place] - Preparing  for Surgery Before surgery, you can play an important role.  Because  skin is not sterile, your skin needs to be as free of germs as possible.  You can reduce the number of germs on your skin by washing with CHG (chlorahexidine gluconate) soap before surgery.  CHG is an antiseptic cleaner which kills germs and bonds with the skin to continue killing germs even after washing. Please DO NOT use if you have an allergy to CHG or antibacterial soaps.  If your skin becomes reddened/irritated stop using the CHG and inform your nurse when you arrive at Short Stay. Do not shave (including legs and underarms) for at least 48 hours prior to the first CHG shower.  You may shave your face/neck. Please follow these instructions carefully:  1.  Shower with CHG Soap the night before surgery and the  morning of Surgery.  2.  If you choose to wash your hair, wash your hair first as usual with your  normal  shampoo.  3.  After you shampoo, rinse your hair and body thoroughly to remove the  shampoo.                                        4.  Use CHG as you would any other liquid soap.  You can apply chg directly  to the skin and wash , chg soap provided, night before and morning of your surgery.  5.  Apply the CHG Soap to your body ONLY FROM THE NECK DOWN.   Do not use on face/ open                           Wound or open sores. Avoid contact with eyes, ears mouth and genitals (private parts).                       Wash face,  Genitals (private parts) with your normal soap.             6.  Wash thoroughly, paying special attention to the area where your surgery  will be performed.  7.  Thoroughly rinse your body with warm water from the neck down.  8.  DO NOT shower/wash with your normal soap after using and rinsing off  the CHG Soap.             9.  Pat yourself dry with a clean towel.            10.  Wear clean pajamas.            11.  Place clean sheets on your bed the night of your first shower and do not  sleep with pets. Day of Surgery : Do not apply any lotions/ powders the  morning of surgery.  Please wear clean clothes to the hospital/surgery center.  IF YOU HAVE ANY SKIN IRRITATION OR PROBLEMS WITH THE SURGICAL SOAP, PLEASE GET A BAR OF GOLD DIAL SOAP AND SHOWER THE NIGHT BEFORE YOUR SURGERY AND THE MORNING OF YOUR SURGERY. PLEASE LET THE NURSE KNOW MORNING OF YOUR SURGERY IF YOU HAD ANY PROBLEMS WITH THE SURGICAL SOAP.   YOUR SURGEON MAY HAVE REQUESTED EXTENDED RECOVERY TIME AFTER YOUR SURGERY. IT COULD BE A  JUST A FEW HOURS  UP TO AN OVERNIGHT STAY.  YOUR SURGEON SHOULD HAVE DISCUSSED THIS WITH YOU  PRIOR TO YOUR SURGERY. IN THE EVENT YOU NEED TO STAY OVERNIGHT PLEASE REFER TO THE FOLLOWING GUIDELINES. YOU MAY HAVE UP TO 4 VISITORS  MAY VISIT IN THE EXTENDED RECOVERY ROOM UNTIL 800 PM ONLY.  ONE  VISITOR AGE 38 AND OVER MAY SPEND THE NIGHT AND MUST BE IN EXTENDED RECOVERY ROOM NO LATER THAN 800 PM . YOUR DISCHARGE TIME AFTER YOU SPEND THE NIGHT IS 900 AM THE MORNING AFTER YOUR SURGERY. YOU MAY PACK A SMALL OVERNIGHT BAG WITH TOILETRIES FOR YOUR OVERNIGHT STAY IF YOU WISH.  REGARDLESS OF IF YOU STAY OVER NIGHT OR ARE DISCHARGED THE SAME DAY YOU WILL BE REQUIRED TO HAVE A RESPONSIBLE ADULT (18 YRS OLD OR OLDER) STAY WITH YOU FOR AT LEAST THE FIRST 24 HOURS  YOUR PRESCRIPTION MEDICATIONS WILL BE PROVIDED DURING YOUR HOSPITAL STAY.  ________________________________________________________________________                                                        QUESTIONS Tanya Wall PRE OP NURSE PHONE [phone].

## 2022-12-30 NOTE — ED Notes (Signed)
Pt ambulated on cont. SPO2. Pt's SPO2 stayed at 100% on RA while ambulating. Pt denied SOB or dizziness while ambulating.

## 2022-12-30 NOTE — ED Notes (Signed)
RN ambulated with pt to restroom, O2 stayed at 100% on room air while ambulating. Pt denied sob and any dizziness.

## 2022-12-30 NOTE — ED Provider Notes (Signed)
I provided a substantive portion of the care of this patient.  I personally made/approved the management plan for this patient and take responsibility for the patient management.  EKG Interpretation Date/Time:  Wednesday December 30 2022 20:31:32 EDT Ventricular Rate:  71 PR Interval:  170 QRS Duration:  97 QT Interval:  438 QTC Calculation: 476 R Axis:   81  Text Interpretation: Sinus rhythm normal, no change from previous Confirmed by Arby Barrette [phone]) on 12/30/2022 10:13:27 PM  F/U labs and hydration.  Patient fasted to get outpatient lab work done at PCP office today.  He had a very small amount to eat afterwards and a glass of wine and became very nauseated and lightheaded.  Patient briefly passed out.  It was reported that she jerked when she was on the ground but return to consciousness without confusion.  No seizure history.  No chest pain or headache initiating event.  I evaluate the patient after hydration.  She is alert with clear mental status.  No neurologic deficits.  Well in appearance.  Basic labs stable except very mild hypokalemia.  Normal GFR.  CBC normal.  EKG reviewed by myself no change from previous.  Suspect orthostatic or vasovagal syncopal episode.  At this time patient stable for discharge.  We have reviewed follow-up plan and return precautions.   Arby Barrette, MD 01/05/23 1324

## 2022-12-30 NOTE — H&P (Signed)
Tanya Wall is an 57 y.o. postmenopausal G1P1 who is admitted for Robotic Assisted Total Laparoscopic Hysterectomy and Bilateral Salpingo-oophorectomy for recurrent postmenopausal bleeding.  Patient initally presented 06/2022 for further discussion of HRT (currently on Estradiol 0.05mg /24h patch twice weekly and Prometrium 200mg  nightly). She reports several episodes of PMB has had dark brown/red clots that were smaller than a dime. States she has had breakthrough bleeding over the last 1-2 years and though this was normal. Of note, patient has one ovary s/p left oophorectomy for ectopic pregnancy.   Cervical polyp was noted on 07/17/2022 at the time of EMB. Unfortunately, this EMB did not contain any endometrial tissue. She underwent Hysteroscopy with D&C on 09/10/22 and endometrial sampling revealed benign proliferative endometrium. She was noted to have stenosis of the internal cervical os and small endometrial cavity. Since her procedure, she has continued to have episodes of bleeding. Due to recurrence of bleeding, patient desires definitive management via hysterectomy.    Work-up: D&C (09/10/2022):  FINAL MICROSCOPIC DIAGNOSIS:   A. ENDOMETRIUM, CURETTAGE:       - Benign proliferative endometrium.       - Benign polypoid endocervical mucosa suggestive of endocervical  polyp.      - Benign cervical squamous epithelium without diagnostic  alteration.      - Negative for glandular hyperplasia, squamous dysplasia or  malignancy.   EMB (08/17/2022): Predominately benign endocervical tissue. Rare scattered benign glands of endometrial origin seen.          EMB (07/17/2022): Benign endocervical tissue. No endometrial tissue identified. Cervical polypectomy - Benign endocervical polyp           TVUS (07/07/2022):    Uterus: 7.63 x 3.75 x 4.68 cm         Endometrial thickness: 0.43 cm         Fibroid 1 1.37 cm  Fibroid 2 1.72 cm         Right Ovary 1.85 cm         Retroverted uterus.  Uterine fibroids - posterior fundal 1.1 x 1.0 x 1.4 cm, posterior 1.6 x 1.4 x 1.7 cm. Inhomogenous echotexture throughout uterus. Endometrium WNL. Right Ovary WNL. Left ovary surgically removed. No free fluid or adnexal masses seen throughout.   Pap smear (2021): NILM/HRHPV negative  Patient Active Problem List   Diagnosis Date Noted   Postmenopausal bleeding 09/02/2022   S/P left knee arthroscopy 09/24/2015    MEDICAL/FAMILY/SOCIAL HX: No LMP recorded. Patient is postmenopausal.    Past Medical History:  Diagnosis Date   Complication of anesthesia    Headache    migraines from weather changes, no meds   Heart murmur    Never has caused any problems "minor"   History of kidney stones 2009   passed stone - approx 15 yrs ago   HLD (hyperlipidemia)    diet controllled - no meds   HSV infection    OSA (obstructive sleep apnea)    mild. does not use CPAP   Plantar fascia syndrome 01/2022   right foot / Follows w/ Dr. Ovid Curd, DPM.   PONV (postoperative nausea and vomiting)    Postmenopausal bleeding 2024    Past Surgical History:  Procedure Laterality Date   ANTERIOR CRUCIATE LIGAMENT REPAIR Left    in [place]   ARTHROSCOPIC REPAIR ACL Left    Dr Valentina Gu   DILATATION & CURETTAGE/HYSTEROSCOPY WITH MYOSURE N/A 09/10/2022   Procedure: DILATATION & CURETTAGE/HYSTEROSCOPY WITH MYOSURE;  Surgeon: Steva Ready, DO;  Location: [facility] OR;  Service: Gynecology;  Laterality: N/A;  rep will be here confirmed on 2/26 CS   EYE SURGERY Bilateral    Lasik   SALPINGOOPHORECTOMY Left    WISDOM TOOTH EXTRACTION      Family History  Problem Relation Age of Onset   Breast cancer Paternal Aunt 57    Social History:  reports that she has never smoked. She has never used smokeless tobacco. She reports current alcohol use. She reports that she does not use drugs.  ALLERGIES/MEDS:  Allergies: No Known Allergies  No medications prior to admission.     Review of Systems   Constitutional: Negative.   HENT: Negative.    Eyes: Negative.   Respiratory: Negative.    Cardiovascular: Negative.   Gastrointestinal: Negative.   Genitourinary: Negative.   Musculoskeletal: Negative.   Skin: Negative.   Neurological: Negative.   Endo/Heme/Allergies: Negative.   Psychiatric/Behavioral: Negative.      Height 5\' 5"  (1.651 m), weight 78.5 kg. Gen:  NAD, pleasant and cooperative Cardio:  RRR Pulm:  CTAB, no wheezes/rales/rhonchi Abd:  Soft, non-distended, non-tender throughout, no rebound/guarding Ext:  No bilateral LE edema, no bilateral calf tenderness Pelvic: Labia - unremarkable, vagina - pink moist mucosa, no lesions or abnormal discharge, cervix - no discharge or lesions or CMT, adnexa - no masses or tenderness - uterus non-tender and normal size on palpation  No results found for this or any previous visit (from the past 24 hour(s)).  No results found.   ASSESSMENT/PLAN: Tanya Wall is a 57 y.o. postmenopausal G1P1 who is admitted for Robotic Assisted Total Laparoscopic Hysterectomy and Bilateral Salpingo-oophorectomy for recurrent postmenopausal bleeding.  - Admit to [facility] - Admit labs (CBC, T&S) - Diet:  Per anesthesia/ERAS pathway - IVF:  Per anesthesia - VTE Prophylaxis:  SCDs - Antibiotics: Ancef 2g on call to OR - D/C home POD#0-1  Consents: I have explained to the patient that his surgery is performed to remove the uterus through several small incisions in the abdomen and that it will result in sterility.  I discussed the risks and benefits of the surgery, including, but not limited to bleeding, including the need for blood transfusion, infection, damage to surround organs and tissues, damage to bladder, damage to ureters, causing kidney damage, and requiring additional procedures, damage to bowels, resulting in further surgery, postoperative pain, short-term and long-term, scarring on the abdominal Wall and intra-abdominally, need for  further surgery, need for conversion to an open procedure, development of an incisional hernia, deep vein thrombosis, and/or pulmonary embolism, wound infection and/or separation, painful intercourse, urinary leakage, ovarian failure, resulting in menopausal symptoms requiring treatment, fistula formation, complications the course of which cannot be predicted or prevented, and death. Patient was consented for blood products.  The patient is aware that bleeding may result in the need for a blood transfusion which includes risk of transmission of HIV (1:2 million), Hepatitis C (1:2 million), and Hepatitis B (1:200 thousand) and transfusion reaction.  Patient voiced understanding of the above risks as well as understanding of indications for blood transfusion.    Steva Ready, DO

## 2022-12-30 NOTE — Progress Notes (Addendum)
Spoke w/ via phone for pre-op interview---[facility] needs dos---- none per anesthesia, surgeon orders pending              Lab results------01/11/23 lab appt for cbc, type & screen, 09/10/22 EKG in Epic & chart COVID test -----patient states asymptomatic no test needed Arrive at -------0830 on Tuesday, 01/19/23 NPO after MN NO Solid Food.  Clear liquids from MN until---0730 Med rec completed Medications to take morning of surgery -----Vivelle dot, Valtrex prn Diabetic medication -----n/a Patient instructed no nail polish to be worn day of surgery Patient instructed to bring photo id and insurance card day of surgery Patient aware to have Driver (ride ) / caregiver    for 24 hours after surgery - husband, Tanya Wall Patient Special Instructions -----Extended / overnight stay instructions given. Pre-Op special Instructions -----Requested orders from Dr. Connye Burkitt via Epic IB on 12/30/22. Patient verbalized understanding of instructions that were given at this phone interview. Patient denies shortness of breath, chest pain, fever, cough at this phone interview.

## 2022-12-31 NOTE — Discharge Instructions (Signed)
1.  At this time blood counts are stable.  No evidence of significant anemia. 2.  I suspect you passed out due to not having eaten and low blood pressure with excessive heat today.  However, if you get any chest pain, shortness of breath, headache or other concerning symptoms return immediately to the emergency department for recheck. 3.  Contact your doctor to schedule a recheck.

## 2023-01-11 ENCOUNTER — Encounter (HOSPITAL_COMMUNITY)
Admission: RE | Admit: 2023-01-11 | Discharge: 2023-01-11 | Disposition: A | Discharge status: Home / Self Care | Payer: Managed Care, Other (non HMO) | Source: Ambulatory Visit | Attending: Obstetrics and Gynecology | Admitting: Obstetrics and Gynecology

## 2023-01-11 DIAGNOSIS — Z01812 Encounter for preprocedural laboratory examination: Secondary | ICD-10-CM | POA: Diagnosis not present

## 2023-01-11 DIAGNOSIS — N950 Postmenopausal bleeding: Secondary | ICD-10-CM | POA: Insufficient documentation

## 2023-01-11 DIAGNOSIS — Z01818 Encounter for other preprocedural examination: Secondary | ICD-10-CM

## 2023-01-11 LAB — CBC
HCT: 37.8 % (ref 36.0–46.0)
Hemoglobin: 12.2 g/dL (ref 12.0–15.0)
MCH: 28.7 pg (ref 26.0–34.0)
MCHC: 32.3 g/dL (ref 30.0–36.0)
MCV: 88.9 fL (ref 80.0–100.0)
Platelets: 270 10*3/uL (ref 150–400)
RBC: 4.25 MIL/uL (ref 3.87–5.11)
RDW: 11.9 % (ref 11.5–15.5)
WBC: 6.7 10*3/uL (ref 4.0–10.5)
nRBC: 0 % (ref 0.0–0.2)

## 2023-01-18 NOTE — Anesthesia Preprocedure Evaluation (Signed)
Anesthesia Evaluation  Patient identified by MRN, date of birth, ID band Patient awake    Reviewed: Allergy & Precautions, NPO status , Patient's Chart, lab work & pertinent test results  History of Anesthesia Complications (+) PONV and history of anesthetic complications  Airway Mallampati: II  TM Distance: >3 FB Neck ROM: Full    Dental no notable dental hx. (+) Teeth Intact, Dental Advisory Given   Pulmonary sleep apnea    Pulmonary exam normal breath sounds clear to auscultation       Cardiovascular negative cardio ROS Normal cardiovascular exam Rhythm:Regular Rate:Normal     Neuro/Psych  Headaches    GI/Hepatic negative GI ROS, Neg liver ROS,,,  Endo/Other  negative endocrine ROS    Renal/GU      Musculoskeletal   Abdominal   Peds  Hematology   Anesthesia Other Findings All: Prometazine  Reproductive/Obstetrics negative OB ROS                             Anesthesia Physical Anesthesia Plan  ASA: 2  Anesthesia Plan: General   Post-op Pain Management: Lidocaine infusion*, Ketamine IV*, Ofirmev IV (intra-op)* and Toradol IV (intra-op)*   Induction: Intravenous  PONV Risk Score and Plan: Midazolam, Dexamethasone, Ondansetron, Treatment may vary due to age or medical condition and Scopolamine patch - Pre-op  Airway Management Planned: Oral ETT  Additional Equipment: None  Intra-op Plan:   Post-operative Plan: Extubation in OR  Informed Consent: I have reviewed the patients History and Physical, chart, labs and discussed the procedure including the risks, benefits and alternatives for the proposed anesthesia with the patient or authorized representative who has indicated his/her understanding and acceptance.     Dental advisory given  Plan Discussed with:   Anesthesia Plan Comments:         Anesthesia Quick Evaluation

## 2023-01-19 ENCOUNTER — Ambulatory Visit (HOSPITAL_BASED_OUTPATIENT_CLINIC_OR_DEPARTMENT_OTHER)
Admission: RE | Admit: 2023-01-19 | Discharge: 2023-01-19 | Disposition: A | Discharge status: Home / Self Care | Payer: Managed Care, Other (non HMO) | Attending: Obstetrics and Gynecology | Admitting: Obstetrics and Gynecology

## 2023-01-19 ENCOUNTER — Other Ambulatory Visit: Payer: Self-pay

## 2023-01-19 ENCOUNTER — Ambulatory Visit (HOSPITAL_BASED_OUTPATIENT_CLINIC_OR_DEPARTMENT_OTHER): Payer: Managed Care, Other (non HMO) | Admitting: Anesthesiology

## 2023-01-19 ENCOUNTER — Encounter (HOSPITAL_BASED_OUTPATIENT_CLINIC_OR_DEPARTMENT_OTHER)
Admission: RE | Disposition: A | Discharge status: Home / Self Care | Payer: Self-pay | Source: Home / Self Care | Attending: Obstetrics and Gynecology

## 2023-01-19 ENCOUNTER — Encounter (HOSPITAL_BASED_OUTPATIENT_CLINIC_OR_DEPARTMENT_OTHER): Payer: Self-pay | Admitting: Obstetrics and Gynecology

## 2023-01-19 DIAGNOSIS — Z87442 Personal history of urinary calculi: Secondary | ICD-10-CM | POA: Insufficient documentation

## 2023-01-19 DIAGNOSIS — G4733 Obstructive sleep apnea (adult) (pediatric): Secondary | ICD-10-CM | POA: Diagnosis not present

## 2023-01-19 DIAGNOSIS — N950 Postmenopausal bleeding: Secondary | ICD-10-CM | POA: Diagnosis not present

## 2023-01-19 DIAGNOSIS — D259 Leiomyoma of uterus, unspecified: Secondary | ICD-10-CM | POA: Diagnosis not present

## 2023-01-19 DIAGNOSIS — Z79890 Hormone replacement therapy (postmenopausal): Secondary | ICD-10-CM | POA: Diagnosis not present

## 2023-01-19 DIAGNOSIS — N888 Other specified noninflammatory disorders of cervix uteri: Secondary | ICD-10-CM | POA: Insufficient documentation

## 2023-01-19 DIAGNOSIS — E785 Hyperlipidemia, unspecified: Secondary | ICD-10-CM | POA: Insufficient documentation

## 2023-01-19 DIAGNOSIS — Z01818 Encounter for other preprocedural examination: Secondary | ICD-10-CM

## 2023-01-19 HISTORY — PX: ROBOTIC ASSISTED LAPAROSCOPIC HYSTERECTOMY AND SALPINGECTOMY: SHX6379

## 2023-01-19 HISTORY — DX: Other complications of anesthesia, initial encounter: T88.59XA

## 2023-01-19 HISTORY — DX: Nausea with vomiting, unspecified: Z98.890

## 2023-01-19 HISTORY — DX: Headache, unspecified: R51.9

## 2023-01-19 HISTORY — PX: OOPHORECTOMY: SHX6387

## 2023-01-19 LAB — TYPE AND SCREEN
ABO/RH(D): AB POS
Antibody Screen: NEGATIVE

## 2023-01-19 SURGERY — XI ROBOTIC ASSISTED LAPAROSCOPIC HYSTERECTOMY AND SALPINGECTOMY
Anesthesia: General | Site: Pelvis | Laterality: Right

## 2023-01-19 MED ORDER — ONDANSETRON HCL 4 MG/2ML IJ SOLN: 4.0000 mg | Freq: Once | INTRAMUSCULAR | Status: DC | PRN

## 2023-01-19 MED ORDER — ACETAMINOPHEN 10 MG/ML IV SOLN
INTRAVENOUS | Status: AC
  Filled 2023-01-19: qty 100

## 2023-01-19 MED ORDER — HYDROMORPHONE HCL 1 MG/ML IJ SOLN
INTRAMUSCULAR | Status: AC
  Filled 2023-01-19: qty 1

## 2023-01-19 MED ORDER — BUPIVACAINE HCL 0.25 % IJ SOLN
INTRAMUSCULAR | Status: DC | PRN
  Administered 2023-01-19: 20 mL

## 2023-01-19 MED ORDER — KETAMINE HCL 50 MG/5ML IJ SOSY
PREFILLED_SYRINGE | INTRAMUSCULAR | Status: AC
  Filled 2023-01-19: qty 5

## 2023-01-19 MED ORDER — LIDOCAINE HCL (PF) 2 % IJ SOLN
INTRAMUSCULAR | Status: DC | PRN
  Administered 2023-01-19: 1.5 mg/kg/h via INTRADERMAL

## 2023-01-19 MED ORDER — STERILE WATER FOR IRRIGATION IR SOLN
Status: DC | PRN
  Administered 2023-01-19: 500 mL

## 2023-01-19 MED ORDER — MIDAZOLAM HCL 2 MG/2ML IJ SOLN
INTRAMUSCULAR | Status: DC | PRN
  Administered 2023-01-19: 2 mg via INTRAVENOUS

## 2023-01-19 MED ORDER — ONDANSETRON HCL 4 MG/2ML IJ SOLN: 4.0000 mg | Freq: Four times a day (QID) | INTRAMUSCULAR | Status: DC | PRN

## 2023-01-19 MED ORDER — MENTHOL 3 MG MT LOZG: 1.0000 | LOZENGE | OROMUCOSAL | Status: DC | PRN

## 2023-01-19 MED ORDER — PROPOFOL 10 MG/ML IV BOLUS
INTRAVENOUS | Status: AC
  Filled 2023-01-19: qty 20

## 2023-01-19 MED ORDER — CEFAZOLIN SODIUM-DEXTROSE 2-4 GM/100ML-% IV SOLN
INTRAVENOUS | Status: AC
  Filled 2023-01-19: qty 100

## 2023-01-19 MED ORDER — ACETAMINOPHEN 500 MG PO TABS: 1000.0000 mg | ORAL_TABLET | Freq: Four times a day (QID) | ORAL | Status: DC

## 2023-01-19 MED ORDER — SCOPOLAMINE 1 MG/3DAYS TD PT72
1.0000 | MEDICATED_PATCH | TRANSDERMAL | Status: DC
  Administered 2023-01-19: 1.5 mg via TRANSDERMAL

## 2023-01-19 MED ORDER — FENTANYL CITRATE (PF) 250 MCG/5ML IJ SOLN
INTRAMUSCULAR | Status: AC
  Filled 2023-01-19: qty 5

## 2023-01-19 MED ORDER — OXYCODONE HCL 5 MG/5ML PO SOLN: 5.0000 mg | Freq: Once | ORAL | Status: DC | PRN

## 2023-01-19 MED ORDER — DIPHENHYDRAMINE HCL 50 MG/ML IJ SOLN
INTRAMUSCULAR | Status: DC | PRN
  Administered 2023-01-19: 12.5 mg via INTRAVENOUS

## 2023-01-19 MED ORDER — MIDAZOLAM HCL 2 MG/2ML IJ SOLN
INTRAMUSCULAR | Status: AC
  Filled 2023-01-19: qty 2

## 2023-01-19 MED ORDER — IBUPROFEN 800 MG PO TABS: 800.0000 mg | ORAL_TABLET | Freq: Three times a day (TID) | ORAL | 1 refills | Status: AC | PRN

## 2023-01-19 MED ORDER — LACTATED RINGERS IV SOLN: INTRAVENOUS | Status: DC

## 2023-01-19 MED ORDER — CEFAZOLIN SODIUM-DEXTROSE 2-4 GM/100ML-% IV SOLN
2.0000 g | INTRAVENOUS | Status: AC
  Administered 2023-01-19: 2 g via INTRAVENOUS

## 2023-01-19 MED ORDER — KETOROLAC TROMETHAMINE 30 MG/ML IJ SOLN
INTRAMUSCULAR | Status: DC | PRN
  Administered 2023-01-19: 30 mg via INTRAVENOUS

## 2023-01-19 MED ORDER — SODIUM CHLORIDE 0.9 % IV SOLN
INTRAVENOUS | Status: DC | PRN
  Administered 2023-01-19: 60 mL

## 2023-01-19 MED ORDER — ONDANSETRON HCL 4 MG PO TABS: 4.0000 mg | ORAL_TABLET | Freq: Four times a day (QID) | ORAL | Status: DC | PRN

## 2023-01-19 MED ORDER — DOCUSATE SODIUM 100 MG PO CAPS
100.0000 mg | ORAL_CAPSULE | Freq: Two times a day (BID) | ORAL | Status: DC
  Administered 2023-01-19: 100 mg via ORAL

## 2023-01-19 MED ORDER — EPHEDRINE SULFATE-NACL 50-0.9 MG/10ML-% IV SOSY
PREFILLED_SYRINGE | INTRAVENOUS | Status: DC | PRN
  Administered 2023-01-19: 5 mg via INTRAVENOUS

## 2023-01-19 MED ORDER — DEXAMETHASONE SODIUM PHOSPHATE 10 MG/ML IJ SOLN
INTRAMUSCULAR | Status: DC | PRN
  Administered 2023-01-19: 10 mg via INTRAVENOUS

## 2023-01-19 MED ORDER — LIDOCAINE 2% (20 MG/ML) 5 ML SYRINGE
INTRAMUSCULAR | Status: DC | PRN
  Administered 2023-01-19: 100 mg via INTRAVENOUS

## 2023-01-19 MED ORDER — OXYCODONE HCL 5 MG PO TABS: 5.0000 mg | ORAL_TABLET | Freq: Once | ORAL | Status: DC | PRN

## 2023-01-19 MED ORDER — IBUPROFEN 200 MG PO TABS: 600.0000 mg | ORAL_TABLET | Freq: Four times a day (QID) | ORAL | Status: DC

## 2023-01-19 MED ORDER — KETOROLAC TROMETHAMINE 30 MG/ML IJ SOLN: 30.0000 mg | Freq: Once | INTRAMUSCULAR | Status: DC | PRN

## 2023-01-19 MED ORDER — ONDANSETRON HCL 4 MG/2ML IJ SOLN
INTRAMUSCULAR | Status: DC | PRN
  Administered 2023-01-19: 4 mg via INTRAVENOUS

## 2023-01-19 MED ORDER — SIMETHICONE 80 MG PO CHEW: 80.0000 mg | CHEWABLE_TABLET | Freq: Four times a day (QID) | ORAL | Status: DC | PRN

## 2023-01-19 MED ORDER — SCOPOLAMINE 1 MG/3DAYS TD PT72
MEDICATED_PATCH | TRANSDERMAL | Status: AC
  Filled 2023-01-19: qty 1

## 2023-01-19 MED ORDER — ROCURONIUM BROMIDE 10 MG/ML (PF) SYRINGE
PREFILLED_SYRINGE | INTRAVENOUS | Status: DC | PRN
  Administered 2023-01-19: 20 mg via INTRAVENOUS
  Administered 2023-01-19: 60 mg via INTRAVENOUS

## 2023-01-19 MED ORDER — HYDROMORPHONE HCL 1 MG/ML IJ SOLN
0.2500 mg | INTRAMUSCULAR | Status: DC | PRN
  Administered 2023-01-19 (×2): 0.25 mg via INTRAVENOUS

## 2023-01-19 MED ORDER — OXYCODONE HCL 5 MG PO TABS: 5.0000 mg | ORAL_TABLET | Freq: Four times a day (QID) | ORAL | 0 refills | Status: AC | PRN

## 2023-01-19 MED ORDER — OXYCODONE HCL 5 MG PO TABS
ORAL_TABLET | ORAL | Status: AC
  Filled 2023-01-19: qty 1

## 2023-01-19 MED ORDER — OXYCODONE HCL 5 MG PO TABS
5.0000 mg | ORAL_TABLET | ORAL | Status: DC | PRN
  Administered 2023-01-19: 5 mg via ORAL

## 2023-01-19 MED ORDER — SODIUM CHLORIDE 0.9 % IR SOLN
Status: DC | PRN
  Administered 2023-01-19: 1000 mL

## 2023-01-19 MED ORDER — DOCUSATE SODIUM 100 MG PO CAPS
ORAL_CAPSULE | ORAL | Status: AC
  Filled 2023-01-19: qty 1

## 2023-01-19 MED ORDER — MORPHINE SULFATE (PF) 4 MG/ML IV SOLN: 1.0000 mg | INTRAVENOUS | Status: DC | PRN

## 2023-01-19 MED ORDER — FENTANYL CITRATE (PF) 250 MCG/5ML IJ SOLN
INTRAMUSCULAR | Status: DC | PRN
  Administered 2023-01-19 (×2): 50 ug via INTRAVENOUS

## 2023-01-19 MED ORDER — PROPOFOL 10 MG/ML IV BOLUS
INTRAVENOUS | Status: DC | PRN
Start: 2023-01-19 — End: 2023-01-19
  Administered 2023-01-19: 150 mg via INTRAVENOUS

## 2023-01-19 MED ORDER — ACETAMINOPHEN 10 MG/ML IV SOLN
INTRAVENOUS | Status: DC | PRN
  Administered 2023-01-19: 1000 mg via INTRAVENOUS

## 2023-01-19 MED ORDER — SUGAMMADEX SODIUM 200 MG/2ML IV SOLN
INTRAVENOUS | Status: DC | PRN
  Administered 2023-01-19: 200 mg via INTRAVENOUS

## 2023-01-19 MED ORDER — KETAMINE HCL 10 MG/ML IJ SOLN
INTRAMUSCULAR | Status: DC | PRN
  Administered 2023-01-19 (×2): 10 mg via INTRAVENOUS

## 2023-01-19 SURGICAL SUPPLY — 80 items
ADH SKN CLS APL DERMABOND .7 (GAUZE/BANDAGES/DRESSINGS) ×1
APL ESCP 73.6OZ SRGCL (TIP)
APL SRG 38 LTWT LNG FL B (MISCELLANEOUS)
APPLICATOR ARISTA FLEXITIP XL (MISCELLANEOUS) IMPLANT
BAG DRN RND TRDRP ANRFLXCHMBR (UROLOGICAL SUPPLIES) ×1
BAG URINE DRAIN 2000ML AR STRL (UROLOGICAL SUPPLIES) ×1 IMPLANT
BARRIER ADHS 3X4 INTERCEED (GAUZE/BANDAGES/DRESSINGS) IMPLANT
BRR ADH 4X3 ABS CNTRL BYND (GAUZE/BANDAGES/DRESSINGS)
CANNULA CAP OBTURATR AIRSEAL 8 (CAP) ×1 IMPLANT
CATH FOLEY 3WAY 5CC 16FR (CATHETERS) ×1 IMPLANT
CAUTERY HOOK MNPLR 1.6 DVNC XI (INSTRUMENTS) IMPLANT
CELLS DAT CNTRL 66122 CELL SVR (MISCELLANEOUS) IMPLANT
COVER BACK TABLE 60X90IN (DRAPES) ×1 IMPLANT
COVER TIP SHEARS 8 DVNC (MISCELLANEOUS) ×1 IMPLANT
DEFOGGER SCOPE WARMER CLEARIFY (MISCELLANEOUS) ×1 IMPLANT
DERMABOND ADVANCED .7 DNX12 (GAUZE/BANDAGES/DRESSINGS) ×1 IMPLANT
DILATOR CANAL MILEX (MISCELLANEOUS) IMPLANT
DRAPE ARM DVNC X/XI (DISPOSABLE) ×4 IMPLANT
DRAPE COLUMN DVNC XI (DISPOSABLE) ×1 IMPLANT
DRAPE SURG IRRIG POUCH 19X23 (DRAPES) ×1 IMPLANT
DRAPE UTILITY XL STRL (DRAPES) ×1 IMPLANT
DRIVER NDL MEGA 8 DVNC XI (INSTRUMENTS) ×1 IMPLANT
DRIVER NDLE MEGA DVNC XI (INSTRUMENTS) ×1 IMPLANT
DURAPREP 26ML APPLICATOR (WOUND CARE) ×1 IMPLANT
ELECT REM PT RETURN 9FT ADLT (ELECTROSURGICAL) ×1
ELECTRODE REM PT RTRN 9FT ADLT (ELECTROSURGICAL) ×1 IMPLANT
FORCEPS BPLR LNG DVNC XI (INSTRUMENTS) ×1 IMPLANT
FORCEPS LONG TIP 8 DVNC XI (FORCEP) ×1 IMPLANT
FORCEPS PROGRASP DVNC XI (FORCEP) IMPLANT
GAUZE 4X4 16PLY ~~LOC~~+RFID DBL (SPONGE) IMPLANT
GLOVE BIO SURGEON STRL SZ 6.5 (GLOVE) ×3 IMPLANT
GLOVE BIOGEL PI IND STRL 6.5 (GLOVE) ×3 IMPLANT
HEMOSTAT ARISTA ABSORB 3G PWDR (HEMOSTASIS) IMPLANT
HOLDER FOLEY CATH W/STRAP (MISCELLANEOUS) IMPLANT
IRRIG SUCT STRYKERFLOW 2 WTIP (MISCELLANEOUS) ×1
IRRIGATION SUCT STRKRFLW 2 WTP (MISCELLANEOUS) ×1 IMPLANT
IV NS 1000ML (IV SOLUTION) ×1
IV NS 1000ML BAXH (IV SOLUTION) IMPLANT
KIT PINK PAD W/HEAD ARE REST (MISCELLANEOUS) ×1
KIT PINK PAD W/HEAD ARM REST (MISCELLANEOUS) ×1 IMPLANT
KIT TURNOVER CYSTO (KITS) ×1 IMPLANT
LEGGING LITHOTOMY PAIR STRL (DRAPES) ×1 IMPLANT
NDL INSUFFLATION 14GA 120MM (NEEDLE) IMPLANT
NEEDLE INSUFFLATION 14GA 120MM (NEEDLE) ×1 IMPLANT
OBTURATOR OPTICAL STND 8 DVNC (TROCAR) ×1
OBTURATOR OPTICALSTD 8 DVNC (TROCAR) ×1 IMPLANT
OCCLUDER COLPOPNEUMO (BALLOONS) ×1 IMPLANT
PACK ROBOT WH (CUSTOM PROCEDURE TRAY) ×1 IMPLANT
PACK ROBOTIC GOWN (GOWN DISPOSABLE) ×1 IMPLANT
PAD OB MATERNITY 4.3X12.25 (PERSONAL CARE ITEMS) ×1 IMPLANT
PAD PREP 24X48 CUFFED NSTRL (MISCELLANEOUS) ×1 IMPLANT
POWDER SURGICEL 3.0 GRAM (HEMOSTASIS) IMPLANT
RETRACTOR WND ALEXIS 18 MED (MISCELLANEOUS) IMPLANT
RTRCTR WOUND ALEXIS 18CM MED (MISCELLANEOUS)
RTRCTR WOUND ALEXIS 18CM SML (INSTRUMENTS)
SAVER CELL AAL HAEMONETICS (INSTRUMENTS) IMPLANT
SCISSORS LAP 5X45 EPIX DISP (ENDOMECHANICALS) IMPLANT
SCISSORS MNPLR CVD DVNC XI (INSTRUMENTS) ×1 IMPLANT
SEAL UNIV 5-12 XI (MISCELLANEOUS) ×2 IMPLANT
SEALER VESSEL EXT DVNC XI (MISCELLANEOUS) IMPLANT
SET IRRIG Y TYPE TUR BLADDER L (SET/KITS/TRAYS/PACK) IMPLANT
SET TRI-LUMEN FLTR TB AIRSEAL (TUBING) IMPLANT
SET TUBE FILTERED XL AIRSEAL (SET/KITS/TRAYS/PACK) ×1 IMPLANT
SLEEVE SCD COMPRESS KNEE MED (STOCKING) ×1 IMPLANT
SPIKE FLUID TRANSFER (MISCELLANEOUS) ×2 IMPLANT
SUT VIC AB 0 CT1 27 (SUTURE) ×2
SUT VIC AB 0 CT1 27XBRD ANBCTR (SUTURE) ×2 IMPLANT
SUT VICRYL 0 UR6 27IN ABS (SUTURE) IMPLANT
SUT VICRYL RAPIDE 4/0 PS 2 (SUTURE) ×2 IMPLANT
SUT VLOC 180 0 9IN GS21 (SUTURE) ×1 IMPLANT
TIP ENDOSCOPIC SURGICEL (TIP) IMPLANT
TIP RUMI ORANGE 6.7MMX12CM (TIP) IMPLANT
TIP UTERINE 5.1X6CM LAV DISP (MISCELLANEOUS) IMPLANT
TIP UTERINE 6.7X10CM GRN DISP (MISCELLANEOUS) IMPLANT
TIP UTERINE 6.7X6CM WHT DISP (MISCELLANEOUS) IMPLANT
TIP UTERINE 6.7X8CM BLUE DISP (MISCELLANEOUS) IMPLANT
TOWEL OR 17X24 6PK STRL BLUE (TOWEL DISPOSABLE) ×1 IMPLANT
TROCAR PORT AIRSEAL 8X120 (TROCAR) IMPLANT
WATER STERILE IRR 1000ML POUR (IV SOLUTION) ×1 IMPLANT
WATER STERILE IRR 500ML POUR (IV SOLUTION) IMPLANT

## 2023-01-19 NOTE — Anesthesia Postprocedure Evaluation (Signed)
Anesthesia Post Note  Patient: Tanya Wall Tanya Wall  Procedure(s) Performed: XI ROBOTIC ASSISTED LAPAROSCOPIC HYSTERECTOMY AND SALPINGECTOMY (Right: Pelvis) OOPHORECTOMY (Right: Pelvis)     Patient location during evaluation: PACU Anesthesia Type: General Level of consciousness: awake and alert Pain management: pain level controlled Vital Signs Assessment: post-procedure vital signs reviewed and stable Respiratory status: spontaneous breathing, nonlabored ventilation, respiratory function stable and patient connected to nasal cannula oxygen Cardiovascular status: blood pressure returned to baseline and stable Postop Assessment: no apparent nausea or vomiting Anesthetic complications: no   No notable events documented.  Last Vitals:  Vitals:   01/19/23 1245 01/19/23 1300  BP: 138/85 128/82  Pulse: 67 70  Resp: 16 14  Temp:    SpO2: 95% 94%    Last Pain:  Vitals:   01/19/23 1300  TempSrc:   PainSc: 6                  Trevor Iha

## 2023-01-19 NOTE — Discharge Summary (Signed)
Physician Discharge Summary  Patient ID: Tanya Wall MRN: [id number] DOB/AGE: [id number] 55 y.o.  Admit date: 01/19/2023 Discharge date: 01/19/2023  Admission Diagnoses: Postmenopausal bleeding (recurrent)  Discharge Diagnoses:  Principal Problem:   Postmenopausal bleeding  Procedure(s): Robotic Assisted Total Laparoscopic Hysterectomy and Right salpingo-oophorectomy   Discharged Condition: good  Hospital Course: Patient was admitted on 01/19/2023 for the above named procedure(s) for the above named diagnoses. Prior to hospital discharge, patient was tolerating PO, ambulating, voiding spontaneously, and pain was well-controlled. See hospital chart for specific details. Patient was discharged home in stable condition.  Consults: None  Significant Diagnostic Studies: None  Treatments: surgery: As documented above  Discharge Exam: Blood pressure (!) 146/91, pulse 81, temperature 98.6 F (37 C), resp. rate 16, height 5\' 5"  (1.651 m), weight 79.9 kg, SpO2 99%.  Disposition: Discharge disposition: 01-Home or Self Care       Discharge Instructions     Call MD for:   Complete by: As directed    Call with any heavy vaginal bleeding (filling up more than one large pad in an hour).   Call MD for:  difficulty breathing, headache or visual disturbances   Complete by: As directed    Call MD for:  extreme fatigue   Complete by: As directed    Call MD for:  hives   Complete by: As directed    Call MD for:  persistant dizziness or light-headedness   Complete by: As directed    Call MD for:  persistant nausea and vomiting   Complete by: As directed    Call MD for:  redness, tenderness, or signs of infection (pain, swelling, redness, odor or green/yellow discharge around incision site)   Complete by: As directed    Call MD for:  severe uncontrolled pain   Complete by: As directed    Call MD for:  temperature >100.4   Complete by: As directed    Diet - low sodium heart  healthy   Complete by: As directed    Driving Restrictions   Complete by: As directed    No driving for at least 2 weeks.   Increase activity slowly   Complete by: As directed    Lifting restrictions   Complete by: As directed    No lifting greater than 10lbs.   Sexual Activity Restrictions   Complete by: As directed    No sexual intercourse or objects in the vagina for at least 6-8 weeks.      Allergies as of 01/19/2023       Reactions   Promethazine Nausea And Vomiting        Medication List     STOP taking these medications    progesterone 200 MG capsule Commonly known as: PROMETRIUM       TAKE these medications    BIOTIN PO Take 1 tablet by mouth daily.   citalopram 40 MG tablet Commonly known as: CELEXA Take 40 mg by mouth at bedtime.   estradiol 0.05 MG/24HR patch Commonly known as: VIVELLE-DOT Place 1 patch onto the skin 2 (two) times a week.   ibuprofen 800 MG tablet Commonly known as: ADVIL Take 1 tablet (800 mg total) by mouth every 8 (eight) hours as needed for mild pain, moderate pain or cramping.   oxyCODONE 5 MG immediate release tablet Commonly known as: Oxy IR/ROXICODONE Take 1 tablet (5 mg total) by mouth every 6 (six) hours as needed for severe pain or breakthrough pain.   valACYclovir 1000 MG  tablet Commonly known as: VALTREX Take 1,000 mg by mouth 2 (two) times daily as needed (outbreaks). HSV - take for 3-5 days         Follow-up Information     Steva Ready, DO Follow up in 2 week(s).   Specialty: Obstetrics and Gynecology Why: Please keep your 2 week and 6 week post-operative visits as previously scheduled. Contact information: [address] [place] [address] [phone]                 Signed: Steva Ready 01/19/2023, 5:33 PM

## 2023-01-19 NOTE — Op Note (Signed)
Pre Op Dx:   Postmenopausal bleeding (recurrent)  Post Op Dx:   Same as pre-operative diagnoses  Procedure:   Robotic Assisted Total Laparoscopic Hysterectomy and Right salpingo-oophorectomy   Surgeon:  Dr. Steva Ready Assistants:  Karmen Stabs, RNFA Anesthesia:  General   EBL:  20cc  IVF:  500cc UOP:  300cc clear yellow urine   Drains:  Foley catheter Specimen removed:  Uterus, cervix, right fallopian tube and ovary - sent to pathology Device(s) implanted: None Case Type:  Clean-contaminated Findings:  Normal-appearing cervix and normal-appearing retroverted and retroflexed uterus. Normal-appearing right ovary and right fallopian tube. Surgical absence of left fallopian tube and ovary. Normal-appearing liver contours. Bilateral ureters visualized with peristalsis pre and post-hysterectomy. Complications: None Indications:  57 y.o. G1P1 with recurrent postmenopausal bleeding on HRT who desires definitive surgical management.  Description of each procedure:  After informed consent the patient was taken to the operating room and placed in dorsal supine position where general endotracheal anesthesia was administered and found to be adequate.  She was placed in dorsal lithotomy position with her arms tucked.  She was prepped and draped in the usual sterile fashion.  A timeout was called and the procedure confirmed.  A RUMI uterine manipulator with the Koh cup and a Foley catheter were placed.   A 5mm intraumbilical incision was made and a trochar was used to enter the abdomen under direct visualization.  Pneumoperitoneum was established and atraumatic entry confirmed. Two additional 5mm ports were placed on either side of the umbilicus under direct visualization. All port sites were injected with 10cc local anesthetic. The pelvis was bathed in a 60cc Ropivicaine solution.  The patient was placed in Trendelenburg position and the [employer] robotic device was docked.  Next, attention was  turned to the console where the hysterectomy was performed.  The right ovary was divided at the infundibulopelvic ligament, the mesosalpinx and broad ligament divided, and the right round ligament was divided. The left round ligament was then divided.  The anterior leaflet of the broad ligament was divided to create a bladder flap. The uterine artery and vein were skeletonized and desiccated superior to the Koh cup.  This process was repeated on the contralateral side.  Uterine blanching was observed.  A circumferential colpotomy was created along the ridge of the Koh cup and the uterus was passed off the field.  The vaginal occluder was placed in the vagina to maintain pneumoperitoneum.  The vaginal cuff was then closed with V-loc suture. Hemostasis confirmed. Arista powder was placed on the vaginal cuff and adnexa bilaterally.  The [employer] robotic device was undocked and all ports were visualized.   The pneumoperitoneum was reduced completely with the assistance of two deep breaths and all ports were removed.  The skin was closed with 4-0 Vicryl in subcuticular fashion with skin glue placed atop each port site.   The vagina was inspected and there were no vaginal tears noted and no foreign objects remaining in the vagina. The vaginal cuff palpated to be intact. The patient was returned to dorsal supine position, awakened and extubated in the OR having appeared to tolerate the procedure well.  All sponge, needle, and instrument counts were correct x 2 at the end of the case.  Disposition:  PACU  Steva Ready, DO

## 2023-01-19 NOTE — Interval H&P Note (Signed)
History and Physical Interval Note:  01/19/2023 9:54 AM  Tanya Wall  has presented today for surgery, with the diagnosis of Postmenopausal Bleeding Cervical Polyp.  The various methods of treatment have been discussed with the patient and family. After consideration of risks, benefits and other options for treatment, the patient has consented to  Procedure(s): XI ROBOTIC ASSISTED LAPAROSCOPIC HYSTERECTOMY AND SALPINGECTOMY (Bilateral) OOPHORECTOMY (Unilateral) as a surgical intervention.  The patient's history has been reviewed, patient examined, no change in status, stable for surgery.  I have reviewed the patient's chart and labs.  Questions were answered to the patient's satisfaction.     Steva Ready

## 2023-01-19 NOTE — Anesthesia Procedure Notes (Signed)
Procedure Name: Intubation Date/Time: 01/19/2023 10:18 AM  Performed by: Dairl Ponder, CRNAPre-anesthesia Checklist: Patient identified, Emergency Drugs available, Suction available and Patient being monitored Patient Re-evaluated:Patient Re-evaluated prior to induction Oxygen Delivery Method: Circle System Utilized Preoxygenation: Pre-oxygenation with 100% oxygen Induction Type: IV induction Ventilation: Mask ventilation without difficulty Laryngoscope Size: Mac and 3 Grade View: Grade I Tube type: Oral Tube size: 7.0 mm Number of attempts: 1 Airway Equipment and Method: Stylet and Oral airway Placement Confirmation: ETT inserted through vocal cords under direct vision, positive ETCO2 and breath sounds checked- equal and bilateral Secured at: 21 cm Tube secured with: Tape Dental Injury: Teeth and Oropharynx as per pre-operative assessment

## 2023-01-19 NOTE — Transfer of Care (Signed)
Immediate Anesthesia Transfer of Care Note  Patient: Tanya Wall  Procedure(s) Performed: XI ROBOTIC ASSISTED LAPAROSCOPIC HYSTERECTOMY AND SALPINGECTOMY (Right: Pelvis) OOPHORECTOMY (Right: Pelvis)  Patient Location: PACU  Anesthesia Type:General  Level of Consciousness: drowsy and patient cooperative  Airway & Oxygen Therapy: Patient Spontanous Breathing and Patient connected to nasal cannula oxygen  Post-op Assessment: Report given to RN and Post -op Vital signs reviewed and stable  Post vital signs: Reviewed and stable  Last Vitals:  Vitals Value Taken Time  BP 131/82 01/19/23 1230  Temp 36.6 C 01/19/23 1220  Pulse 66 01/19/23 1235  Resp 14 01/19/23 1235  SpO2 95 % 01/19/23 1235  Vitals shown include unfiled device data.  Last Pain:  Vitals:   01/19/23 1230  TempSrc:   PainSc: Asleep      Patients Stated Pain Goal: 6 (01/19/23 0857)  Complications: No notable events documented.

## 2023-01-20 ENCOUNTER — Encounter (HOSPITAL_BASED_OUTPATIENT_CLINIC_OR_DEPARTMENT_OTHER): Payer: Self-pay | Admitting: Obstetrics and Gynecology

## 2023-01-20 LAB — SURGICAL PATHOLOGY

## 2023-08-04 ENCOUNTER — Other Ambulatory Visit: Payer: Self-pay | Admitting: Internal Medicine

## 2023-08-04 DIAGNOSIS — Z1231 Encounter for screening mammogram for malignant neoplasm of breast: Secondary | ICD-10-CM

## 2023-08-11 ENCOUNTER — Ambulatory Visit
Admission: RE | Admit: 2023-08-11 | Discharge: 2023-08-11 | Disposition: A | Discharge status: Home / Self Care | Payer: Managed Care, Other (non HMO) | Source: Ambulatory Visit

## 2023-08-11 DIAGNOSIS — Z1231 Encounter for screening mammogram for malignant neoplasm of breast: Secondary | ICD-10-CM

## 2023-12-10 ENCOUNTER — Other Ambulatory Visit (HOSPITAL_BASED_OUTPATIENT_CLINIC_OR_DEPARTMENT_OTHER): Payer: Self-pay | Admitting: Internal Medicine

## 2023-12-10 DIAGNOSIS — Z8262 Family history of osteoporosis: Secondary | ICD-10-CM

## 2023-12-20 ENCOUNTER — Ambulatory Visit (INDEPENDENT_AMBULATORY_CARE_PROVIDER_SITE_OTHER)

## 2023-12-20 DIAGNOSIS — Z13820 Encounter for screening for osteoporosis: Secondary | ICD-10-CM | POA: Diagnosis not present

## 2023-12-20 DIAGNOSIS — M8589 Other specified disorders of bone density and structure, multiple sites: Secondary | ICD-10-CM

## 2023-12-20 DIAGNOSIS — Z8262 Family history of osteoporosis: Secondary | ICD-10-CM
